# Patient Record
Sex: Male | Born: 1975 | Race: White | Hispanic: Yes | Marital: Married | Smoking: Never smoker
Health system: Southern US, Community
[De-identification: ages and names within clinical notes are randomized; demographics above are authoritative.]

## PROBLEM LIST (undated history)

## (undated) ENCOUNTER — Emergency Department (HOSPITAL_COMMUNITY): Admission: EM | Payer: No Typology Code available for payment source

## (undated) DIAGNOSIS — I10 Essential (primary) hypertension: Secondary | ICD-10-CM

---

## 2006-04-09 ENCOUNTER — Emergency Department (HOSPITAL_COMMUNITY): Admission: EM | Admit: 2006-04-09 | Discharge: 2006-04-09 | Payer: Self-pay | Admitting: Emergency Medicine

## 2009-12-06 ENCOUNTER — Emergency Department (HOSPITAL_COMMUNITY): Admission: EM | Admit: 2009-12-06 | Discharge: 2009-12-06 | Payer: Self-pay | Admitting: Emergency Medicine

## 2011-01-02 LAB — GC/CHLAMYDIA PROBE AMP, GENITAL
Chlamydia, DNA Probe: NEGATIVE
GC Probe Amp, Genital: POSITIVE — AB

## 2015-03-12 ENCOUNTER — Ambulatory Visit (INDEPENDENT_AMBULATORY_CARE_PROVIDER_SITE_OTHER): Payer: 59

## 2015-03-12 ENCOUNTER — Ambulatory Visit (INDEPENDENT_AMBULATORY_CARE_PROVIDER_SITE_OTHER): Payer: 59 | Admitting: Family Medicine

## 2015-03-12 VITALS — BP 124/80 | HR 91 | Temp 98.9°F | Resp 17 | Ht 67.5 in | Wt 180.0 lb

## 2015-03-12 DIAGNOSIS — S62306A Unspecified fracture of fifth metacarpal bone, right hand, initial encounter for closed fracture: Secondary | ICD-10-CM | POA: Diagnosis not present

## 2015-03-12 DIAGNOSIS — T148 Other injury of unspecified body region: Secondary | ICD-10-CM | POA: Diagnosis not present

## 2015-03-12 DIAGNOSIS — M79641 Pain in right hand: Secondary | ICD-10-CM

## 2015-03-12 DIAGNOSIS — M25531 Pain in right wrist: Secondary | ICD-10-CM

## 2015-03-12 DIAGNOSIS — S6991XA Unspecified injury of right wrist, hand and finger(s), initial encounter: Secondary | ICD-10-CM

## 2015-03-12 DIAGNOSIS — T148XXA Other injury of unspecified body region, initial encounter: Secondary | ICD-10-CM

## 2015-03-12 MED ORDER — OXYCODONE-ACETAMINOPHEN 5-325 MG PO TABS: 1.0000 | ORAL_TABLET | Freq: Three times a day (TID) | ORAL | Status: DC | PRN

## 2015-03-12 NOTE — Patient Instructions (Signed)
Fractura de [place], quinto metacarpo (Mining engineerHand Fracture, Fifth Metacarpal) El pequeo metacarpo es el hueso que se encuentra en la base del dedo meique entre el nudillo y la [place]. Una fractura es la ruptura de ese hueso. Una de las fracturas ms comunes en este hueso se denomina "fractura de boxeador". TRATAMIENTO Las fracturas pueden tratarse con los siguientes mtodos:   Reduccin (se colocan nuevamente los huesos en su Environmental consultantlugar), Express Scriptsluego se inmovilizan (sin cortar la piel) para Photographermantener la posicin y se enyesan por aproximadamente seis semanas, o el tiempo que el profesional que lo asiste determine como necesario.  RAFI (reduccin abierta, fijacin interna) el sitio de Printmakerla fractura se abre y las partes del hueso se Industrial/product designercolocan en su lugar con Programmer, applicationsclavos; luego se enyesa durante aproximadamente seis semanas, o el tiempo que el profesional que lo asiste determine como necesario. El profesional que lo Magazine features editorasiste le comentar el tipo de fractura especfica que usted tiene y cual sera el mejor tratamiento para su problema. Si la ciruga fuera el tratamiento de eleccin, la informacin que damos a continuacin es para su uso y para que le deje saber al profesional que lo asiste antes de la Azerbaijanciruga.  INFORME AL PROFESIONAL QUE LO ASISTE SOBRE LOS SIGUIENTES PUNTOS:  Alergias  Medicacin que toma, incluyendo hierbas, gotas oftlmicas, medicamentos de venta libre y cremas  Uso de esteroides (por va oral o cremas)  Problemas anteriores debido a anestsicos o a la novocana  Posibilidad de Psychiatristembarazo, si correspondiera  Antecedentes de haber tenido cogulos sanguneos (tromboflebitis)  Antecedentes de hemorragias o problemas sanguneos  Cirugas previas.  Otros problemas de salud LUEGO DE LA CIRUGA Despus de la ciruga lo llevarn al rea de recuperacin donde un(a) enfermero(a) lo cuidar y Chief Operating Officercontrolar su evolucin. Una vez que despierte, se encuentre estabilizado y pueda ingerir lquidos, excepto que ocurra un  imprevisto, usted podr volver a su casa. Una vez que se encuentre en su hogar, aplique una bolsa de hielo en la zona de la Forganciruga, lo que puede ayudarlo a disminuir las molestias y Restaurant manager, fast fooddetener la hinchazn. INSTRUCCIONES PARA EL CUIDADO DOMICILIARIO  Siga las instrucciones del profesional que lo asiste relacionadas con las actividades, ejercicio, fisioterapia y la conduccin de automviles.  El ejercicio diario es beneficioso para mantener la amplitud de movimientos (movimiento y movilidad) y Primary school teacherla fuerza. Realice los ejercicios tal como se le instruy.  Para disminuir la hinchazn, mantenga elevada la mano lesionada por encima del nivel del corazn, todo el tiempo que le sea posible.  Aplique hielo sobre la lesin durante 15 a 20 minutos por hora mientras se encuentre despierto, durante los 2 primeros Soudersburgdas. Coloque el hielo en una bolsa plstica y ponga una toalla delgada entre la bolsa y Woodstonel yeso.  Mueva los dedos de la mano enyesada varias veces por da.  Si le colocaron un yeso o un molde de Lequirefibra de vidrio:  No trate de rascarse la piel por debajo del molde utilizando objetos filosos o puntiagudos.  Controle todos los Darden Restaurantsdas la piel de alrededor del yeso. Puede colocarse una locin en las zonas rojas o doloridas.  Mantenga el yeso seco. Puede protegerlo durante el bao con una bolsa plstica. No coloque el yeso dentro del agua.  Si le colocaron un entablillado:  selo durante el tiempo que se lo indique su mdico o hasta que concurra a la Mildredconsulta de seguimiento.  No lo moje. Puede protegerlo durante el bao con una bolsa plstica.  Puede aflojar el vendaje elstico que rodea la  tablilla si los dedos se entumecen, siente hormigueo, se enfran o se vuelven de color azul.  No ejerza presin en ninguna parte del yeso o tablilla; podra romperse. Especialmente, no apoye el yeso sobre superficies duras durante las primeras veinticuatro horas despus de la aplicacin.  Tome la medicacin que le  indic el profesional que lo asiste.  Utilice los medicamentos de venta libre o de prescripcin para Chief Technology Officer, Environmental health practitioner o la [place], segn se lo indique el profesional que lo asiste.  Realice el seguimiento segn las instrucciones que le ha dado el profesional que lo asiste. Esto es muy importante para evitar una lesin o discapacidad permanente y Chief Technology Officer crnico.  Siga las instrucciones relativas a la derivacin a otros mdicos, a la fisioterapia, y a Product/process development scientist. Cualquier demora en obtener la atencin necesaria puede resultar en una lesin [place], discapacidad y dolor crnico. SOLICITE ATENCIN MDICA SI:  Si aumenta el sangrado (ms de una pequea mancha) en la zona de la herida o debajo del yeso o entablillado si tiene una herida debajo del yeso, consecuencia de la [place].  Presenta enrojecimiento, hinchazn o aumento del dolor en la herida o por debajo del yeso o la tablilla.  Observa pus que proviene de la herida o debajo del yeso o tablilla.  La temperatura oral se eleva sin motivo por encima de 38,9 C (102 F).  Percibe un olor ftido que proviene de la herida, del vendaje o de debajo del yeso o tablilla.  Usted no puede mover el dedo meique. SOLICITE ATENCIN MDICA DE INMEDIATO SI: Presenta una erupcin cutnea, siente dificultad para respirar o tiene algn otro problema de alergia. Si no tiene Personal assistant yeso para observar la lesin, podr Insurance risk surveyor prdida pequea de sangre como una mancha en el exterior del yeso. Informe todos estos hechos al profesional que lo asiste. EST SEGURO QUE:   Comprende las instrucciones para el alta mdica.  Controlar su enfermedad.  Solicitar atencin mdica de inmediato segn las indicaciones. Document Released: 09/28/2005 Document Revised: 12/21/2011 [facility] Patient Information 2015 [place], [place]. This information is not intended to replace advice given to you by your health care provider. Make sure you  discuss any questions you have with your health care provider.

## 2015-03-12 NOTE — Progress Notes (Signed)
MRN: [id number] DOB: 1975/12/06  Subjective:   [name] [name] is a 39 y.o. male presenting for chief complaint of Hand Injury  Reports one-day history of right hand injury. Patient states that he was moving furniture, furniture slipped and fell over his right hand, patient states that he was able to lift the furniture up with his other hand to free his right hand. Patient felt immediate pain with swelling, has difficulty using his right hand with gripping and moving his fingers due to his pain. Also reports right wrist pain. 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 Denies decreased sensation, erythema, bruising, bony deformity. Has not tried any medications for relief. Denies any other aggravating or relieving factors, no other questions or concerns.  [name] has a current medication list which includes the following prescription(s): oxycodone-acetaminophen. He has No Known Allergies.  [name]  has no past medical history on file. Also  has no past surgical history on file.  ROS As in subjective.  Objective:   Vitals: BP 124/80 mmHg  Pulse 91  Temp(Src) 98.9 F (37.2 C) (Oral)  Resp 17  Ht 5' 7.5" (1.715 m)  Wt 180 lb (81.647 kg)  BMI 27.76 kg/m2  SpO2 98%  Physical Exam  Constitutional: He is oriented to person, place, and time. He appears well-developed and well-nourished.  Cardiovascular: Normal rate.   Pulmonary/Chest: Effort normal.  Musculoskeletal:       Right hand: He exhibits tenderness, bony tenderness and swelling. He exhibits normal range of motion, normal capillary refill, no deformity and no laceration. Normal sensation noted. Decreased strength (secondary to pain) noted.       Hands: Neurological: He is alert and oriented to person, place, and time.  Skin: Skin is warm and dry. No rash noted. No pallor.   [facility] reading (PRIMARY) by  Dr. Alwyn RenHopper and PA-Shamon Cothran. Right hand and right wrist:  comminuted fracture of base of 5th metacarpal involving joint space. No other acute findings.  Dg Wrist Complete Right  03/12/2015   CLINICAL DATA:  Recent injury with right hand pain, initial encounter  EXAM: RIGHT WRIST - COMPLETE 3+ VIEW  COMPARISON:  None.  FINDINGS: There is a fracture through the base of the fifth metacarpal identified with mild lateral displacement of the more distal fracture fragments. Soft tissue swelling is noted. No other fractures are seen.  IMPRESSION: Fracture through the base of the fifth metacarpal.   Electronically Signed   By: Alcide CleverMark  Lukens M.D.   On: 03/12/2015 20:46   Dg Hand Complete Right  03/12/2015   CLINICAL DATA:  Right hand injury with pain, initial encounter  EXAM: RIGHT HAND - COMPLETE 3+ VIEW  COMPARISON:  None.  FINDINGS: There is a comminuted fracture through the base of the fifth metacarpal with some angulation at the fracture site. No other fractures are seen. Generalized soft tissue swelling is noted about the wrist.  IMPRESSION: Fifth metacarpal fracture.   Electronically Signed   By: Alcide CleverMark  Lukens M.D.   On: 03/12/2015 20:47   PROCEDURE NOTE: Ulnar gutter splint Applied a 10"x5" ulnar gutter splint to right hand up to DIP of 5th finger, hand was placed in slight extension.  Assessment and Plan :   1. Hand injury, right, initial encounter 2. Right hand pain 3. Right wrist pain 4. Comminuted fracture of bone 5. Fracture of fifth metacarpal bone of right hand,  closed, initial encounter - Stabilized with ulnar gutter splint as above, refer to hand specialist [place], provided with prescription for Percocet for right hand pain  Wallis Bamberg, PA-C [facility] [phone] 03/12/2015 8:27 PM

## 2015-03-13 ENCOUNTER — Telehealth: Payer: Self-pay

## 2015-03-13 NOTE — Telephone Encounter (Signed)
[facility] 03/19/15 at 11:45 am. 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 He is requesting a work note to write him out until his appointment [facility]. Please fax to [phone] attn Domenic MorasGlenn Blackburn. If any questions please call patients nephew at [phone]

## 2015-03-14 NOTE — Progress Notes (Signed)
  Subjective:  Patient ID: [name] [name], male    DOB: Jul 16, 1976  Age: 39 y.o. MRN: [id number]  Discussed with Gurney MaxinMike Mani, PA. Xray examined and proximal 5th metacarpal comminuted fracture with joint involvement noted.     Objective:   Did not examine patient2  Assessment & Plan:   Assessment: Fracture 5th metacarpal right hand  Plan: Gutter splint, refer to ortho hand this week. Kristofer Schaffert, MD 03/14/2015

## 2015-03-14 NOTE — Telephone Encounter (Signed)
Can note be written? Please advise.

## 2015-03-15 NOTE — Telephone Encounter (Signed)
Note done and faxed.

## 2015-03-15 NOTE — Telephone Encounter (Signed)
Fine to write note

## 2015-09-21 ENCOUNTER — Encounter (HOSPITAL_COMMUNITY): Payer: Self-pay

## 2015-09-21 ENCOUNTER — Emergency Department (HOSPITAL_COMMUNITY)
Admission: EM | Admit: 2015-09-21 | Discharge: 2015-09-21 | Disposition: A | Discharge status: Home / Self Care | Payer: 59 | Attending: Emergency Medicine | Admitting: Emergency Medicine

## 2015-09-21 DIAGNOSIS — N390 Urinary tract infection, site not specified: Secondary | ICD-10-CM | POA: Insufficient documentation

## 2015-09-21 DIAGNOSIS — R319 Hematuria, unspecified: Secondary | ICD-10-CM

## 2015-09-21 DIAGNOSIS — R369 Urethral discharge, unspecified: Secondary | ICD-10-CM | POA: Diagnosis present

## 2015-09-21 LAB — URINE MICROSCOPIC-ADD ON

## 2015-09-21 LAB — URINALYSIS, ROUTINE W REFLEX MICROSCOPIC
BILIRUBIN URINE: NEGATIVE
GLUCOSE, UA: NEGATIVE mg/dL
Ketones, ur: NEGATIVE mg/dL
Nitrite: NEGATIVE
PROTEIN: NEGATIVE mg/dL
Specific Gravity, Urine: 1.023 (ref 1.005–1.030)
pH: 6 (ref 5.0–8.0)

## 2015-09-21 LAB — RPR: RPR: NONREACTIVE

## 2015-09-21 LAB — HIV ANTIBODY (ROUTINE TESTING W REFLEX): HIV Screen 4th Generation wRfx: NONREACTIVE

## 2015-09-21 MED ORDER — AZITHROMYCIN 250 MG PO TABS
1000.0000 mg | ORAL_TABLET | Freq: Once | ORAL | Status: AC
  Administered 2015-09-21: 1000 mg via ORAL
  Filled 2015-09-21: qty 4

## 2015-09-21 MED ORDER — CEFTRIAXONE SODIUM 250 MG IJ SOLR
250.0000 mg | Freq: Once | INTRAMUSCULAR | Status: AC
  Administered 2015-09-21: 250 mg via INTRAMUSCULAR
  Filled 2015-09-21: qty 250

## 2015-09-21 MED ORDER — LIDOCAINE HCL (PF) 1 % IJ SOLN
1.0000 mL | Freq: Once | INTRAMUSCULAR | Status: AC
  Administered 2015-09-21: 1 mL
  Filled 2015-09-21: qty 5

## 2015-09-21 MED ORDER — CEPHALEXIN 250 MG PO CAPS
500.0000 mg | ORAL_CAPSULE | Freq: Once | ORAL | Status: AC
  Administered 2015-09-21: 500 mg via ORAL
  Filled 2015-09-21: qty 2

## 2015-09-21 MED ORDER — CEPHALEXIN 500 MG PO CAPS: 500.0000 mg | ORAL_CAPSULE | Freq: Four times a day (QID) | ORAL | Status: DC

## 2015-09-21 MED ORDER — PHENAZOPYRIDINE HCL 200 MG PO TABS: 200.0000 mg | ORAL_TABLET | Freq: Three times a day (TID) | ORAL | Status: DC | PRN

## 2015-09-21 NOTE — ED Notes (Signed)
Pt states that he has puss draining from his penis that started yesterday. Pt complains of difficulty urinating and pain while urinating.

## 2015-09-21 NOTE — Discharge Instructions (Signed)
You have been treated for gonorrhea and chlamydia. We have tested you for gonorrhea, chlamydia, HIV and syphilis. If any of these tests are positive, you will be contacted.   Infeccin urinaria  (Urinary Tract Infection)  La infeccin urinaria puede ocurrir en Corporate treasurercualquier lugar del tracto urinario. El tracto urinario es un sistema de drenaje del cuerpo por el que se eliminan los desechos y el exceso de [place]. El tracto urinario est formado por dos riones, dos urteres, la vejiga y Engineer, miningla uretra. Los riones son rganos que tienen forma de frijol. Cada rin tiene aproximadamente el tamao del puo. Estn situados debajo de las Marquettecostillas, uno a cada lado de la columna vertebral CAUSAS  La causa de la infeccin son los microbios, que son organismos microscpicos, que incluyen hongos, virus, y bacterias. Estos organismos son tan pequeos que slo pueden verse a travs del microscopio. Las bacterias son los microorganismos que ms comnmente causan infecciones urinarias.  SNTOMAS  Los sntomas pueden variar segn la edad y el sexo del paciente y por la ubicacin de la infeccin. Los sntomas en las mujeres jvenes incluyen la necesidad frecuente e intensa de orinar y una sensacin dolorosa de ardor en la vejiga o en la uretra durante la miccin. Las mujeres y los hombres mayores podrn sentir cansancio, temblores y debilidad y Futures tradersentir dolores musculares y Engineer, miningdolor abdominal. Si tiene Sterling Ranchfiebre, puede significar que la infeccin est en los riones. Otros sntomas son dolor en la espalda o en los lados debajo de las [place]costillas, nuseas y vmitos.  DIAGNSTICO  Para diagnosticar una infeccin urinaria, el mdico le preguntar acerca de sus sntomas. Genuine Partsambin le solicitar una Pocassetmuestra de Comorosorina. La muestra de orina se analiza para Engineer, manufacturingdetectar bacterias y glbulos blancos de Risk managerla sangre. Los glbulos blancos se forman en el organismo para ayudar a Artistcombatir las infecciones.  TRATAMIENTO  Por lo general, las infecciones urinarias  pueden tratarse con medicamentos. Debido a que la Harley-Davidsonmayora de las infecciones son causadas por bacterias, por lo general pueden tratarse con antibiticos. La eleccin del antibitico y la duracin del tratamiento depender de sus sntomas y el tipo de bacteria causante de la infeccin.  INSTRUCCIONES PARA EL CUIDADO EN EL HOGAR   Si le recetaron antibiticos, tmelos exactamente como su mdico le indique. Termine el medicamento aunque se sienta mejor despus de haber tomado slo algunos.  Beba gran cantidad de lquido para mantener la orina de tono claro o color amarillo plido.  Evite la cafena, el t y las [address] Placebebidas gaseosas. Estas sustancias irritan la vejiga.  Vaciar la vejiga con frecuencia. Evite retener la orina durante largos perodos.  Vace la vejiga antes y despus de Management consultanttener relaciones sexuales.  Despus de mover el intestino, las mujeres deben higienizarse la regin perineal desde adelante hacia atrs. Use slo un papel tissue por vez. SOLICITE ATENCIN MDICA SI:   Siente dolor en la espalda.  Le sube la fiebre.  Los sntomas no mejoran luego de [address] das. SOLICITE ATENCIN MDICA DE INMEDIATO SI:   Siente dolor intenso en la espalda o en la zona inferior del abdomen.  Comienza a sentir escalofros.  Tiene nuseas o vmitos.  Tiene una sensacin continua de quemazn o molestias al ConocoPhillipsorinar. ASEGRESE DE QUE:   Comprende estas instrucciones.  Controlar su enfermedad.  Solicitar ayuda de inmediato si no mejora o empeora.   Esta informacin no tiene Theme park managercomo fin reemplazar el consejo del mdico. Asegrese de hacerle al mdico cualquier pregunta que tenga.   Document Released: 07/08/2005 Document  Revised: 06/22/2012 Elsevier Interactive Patient Education [employer]

## 2015-09-21 NOTE — ED Provider Notes (Signed)
TIME SEEN: 5:10 AM  CHIEF COMPLAINT: "I have pus from my penis"  HPI: Patient is a 39 y.o. male who is sexually active with his wife and has no medical history who presents to the emergency department with purulent drainage from his penis that he reports her yesterday. He is having dysuria. No hematuria, urgency or frequency. He is not a diabetic. Denies prior history of STDs. Denies any other sexual partners. Denies fever, nausea, vomiting or diarrhea. No testicular pain or swelling.   Patient speaks Spanish. Phone interpreter used.  ROS: See HPI Constitutional: no fever  Eyes: no drainage  ENT: no runny nose   Cardiovascular:  no chest pain  Resp: no SOB  GI: no vomiting GU:  dysuria Integumentary: no rash  Allergy: no hives  Musculoskeletal: no leg swelling  Neurological: no slurred speech ROS otherwise negative  PAST MEDICAL HISTORY/PAST SURGICAL HISTORY:  History reviewed. No pertinent past medical history.  MEDICATIONS:  Prior to Admission medications   Medication Sig Start Date End Date Taking? Authorizing Provider  oxyCODONE-acetaminophen (ROXICET) 5-325 MG per tablet Take 1 tablet by mouth every 8 (eight) hours as needed for severe pain. 03/12/15   Wallis BambergMario Mani, PA-C    ALLERGIES:  No Known Allergies  SOCIAL HISTORY:  Social History  Substance Use Topics  . Smoking status: Never Smoker   . Smokeless tobacco: Not on file  . Alcohol Use: 0.0 oz/week    0 Standard drinks or equivalent per week    FAMILY HISTORY: History reviewed. No pertinent family history.  EXAM: BP 136/87 mmHg  Pulse 72  Temp(Src) 98.6 F (37 C) (Oral)  Resp 14  SpO2 99% CONSTITUTIONAL: Alert and oriented and responds appropriately to questions. Well-appearing; well-nourished HEAD: Normocephalic EYES: Conjunctivae clear, PERRL ENT: normal nose; no rhinorrhea; moist mucous membranes; pharynx without lesions noted NECK: Supple, no meningismus, no LAD  CARD: RRR; S1 and S2 appreciated; no  murmurs, no clicks, no rubs, no gallops RESP: Normal chest excursion without splinting or tachypnea; breath sounds clear and equal bilaterally; no wheezes, no rhonchi, no rales, no hypoxia or respiratory distress, speaking full sentences ABD/GI: Normal bowel sounds; non-distended; soft, non-tender, no rebound, no guarding, no peritoneal signs GU:  GU:  Normal external genitalia, uncircumcised male, normal penile shaft, no blood or discharge at the urethral meatus but there is yellow appearing discharge around the glans, no swelling of the glans or shaft, no testicular masses or tenderness on exam, no scrotal masses or swelling, no hernias appreciated, 2+ femoral pulses bilaterally; no perineal erythema, warmth, subcutaneous air or crepitus; no high riding testicle, normal bilateral cremasteric reflex BACK:  The back appears normal and is non-tender to palpation, there is no CVA tenderness EXT: Normal ROM in all joints; non-tender to palpation; no edema; normal capillary refill; no cyanosis, no calf tenderness or swelling    SKIN: Normal color for age and race; warm NEURO: Moves all extremities equally, sensation to light touch intact diffusely, cranial nerves II through XII intact PSYCH: The patient's mood and manner are appropriate. Grooming and personal hygiene are appropriate.  MEDICAL DECISION MAKING: Patient here with possible STD, UTI. No signs of balanitis, phimosis. Abdominal exam benign. Hemodynamically stable. No testicular tenderness or masses. Urine shows hemoglobin, leukocytes and rare bacteria. Culture pending. Will treat UTI with Keflex. He is also been given ceftriaxone 250 mg IM and azithromycin 1 g by mouth for possible STD. Gonorrhea, chlamydia, HIV and syphilis tests pending. Have provided him outpatient resources. Discussed return  precautions. Have advised him to avoid sexual activity until he is aware of his STD results and if they're positive and his wife will need to be treated as  well. He verbalized understanding and is comfortable with this plan.       Layla Maw Shaquella Stamant, DO 09/21/15 [phone]

## 2015-09-21 NOTE — ED Notes (Signed)
Interpreter [name] HoardLouis [phone]

## 2015-09-22 LAB — URINE CULTURE: Culture: NO GROWTH

## 2015-09-23 LAB — GC/CHLAMYDIA PROBE AMP (~~LOC~~) NOT AT ARMC
CHLAMYDIA, DNA PROBE: NEGATIVE
Neisseria Gonorrhea: POSITIVE — AB

## 2015-09-24 ENCOUNTER — Telehealth (HOSPITAL_COMMUNITY): Payer: Self-pay

## 2015-09-24 NOTE — Telephone Encounter (Signed)
Positive for gonorrhea. Treated per protocol. DHHS form faxed. Attempting to contact.  

## 2015-09-26 ENCOUNTER — Telehealth (HOSPITAL_BASED_OUTPATIENT_CLINIC_OR_DEPARTMENT_OTHER): Payer: Self-pay | Admitting: Emergency Medicine

## 2015-09-28 ENCOUNTER — Telehealth (HOSPITAL_COMMUNITY): Payer: Self-pay

## 2015-09-28 NOTE — Telephone Encounter (Signed)
Unable to reach by telephone. Letter sent to address on record.  

## 2015-10-15 ENCOUNTER — Telehealth (HOSPITAL_COMMUNITY): Payer: Self-pay

## 2015-10-15 NOTE — Telephone Encounter (Signed)
Unable to reach by phone or mail.  Chart closed.   

## 2016-08-04 ENCOUNTER — Ambulatory Visit (INDEPENDENT_AMBULATORY_CARE_PROVIDER_SITE_OTHER): Payer: 59 | Admitting: Physician Assistant

## 2016-08-04 VITALS — BP 124/82 | HR 84 | Temp 98.9°F | Resp 17 | Ht 68.5 in | Wt 188.0 lb

## 2016-08-04 DIAGNOSIS — G44209 Tension-type headache, unspecified, not intractable: Secondary | ICD-10-CM | POA: Diagnosis not present

## 2016-08-04 DIAGNOSIS — D696 Thrombocytopenia, unspecified: Secondary | ICD-10-CM | POA: Diagnosis not present

## 2016-08-04 DIAGNOSIS — R809 Proteinuria, unspecified: Secondary | ICD-10-CM | POA: Diagnosis not present

## 2016-08-04 DIAGNOSIS — Z23 Encounter for immunization: Secondary | ICD-10-CM

## 2016-08-04 LAB — POCT URINALYSIS DIP (MANUAL ENTRY)
BILIRUBIN UA: NEGATIVE
BILIRUBIN UA: NEGATIVE
GLUCOSE UA: NEGATIVE
Leukocytes, UA: NEGATIVE
NITRITE UA: NEGATIVE
PH UA: 6.5
Protein Ur, POC: 30 — AB
RBC UA: NEGATIVE
SPEC GRAV UA: 1.02
Urobilinogen, UA: 1

## 2016-08-04 LAB — COMPLETE METABOLIC PANEL WITH GFR
ALBUMIN: 4.5 g/dL (ref 3.6–5.1)
ALK PHOS: 84 U/L (ref 40–115)
ALT: 87 U/L — ABNORMAL HIGH (ref 9–46)
AST: 81 U/L — ABNORMAL HIGH (ref 10–40)
BILIRUBIN TOTAL: 1 mg/dL (ref 0.2–1.2)
BUN: 16 mg/dL (ref 7–25)
CALCIUM: 9.2 mg/dL (ref 8.6–10.3)
CO2: 20 mmol/L (ref 20–31)
Chloride: 102 mmol/L (ref 98–110)
Creat: 0.85 mg/dL (ref 0.60–1.35)
Glucose, Bld: 107 mg/dL — ABNORMAL HIGH (ref 65–99)
POTASSIUM: 4 mmol/L (ref 3.5–5.3)
SODIUM: 137 mmol/L (ref 135–146)
TOTAL PROTEIN: 7.9 g/dL (ref 6.1–8.1)

## 2016-08-04 LAB — POCT CBC
Granulocyte percent: 62.6 %G (ref 37–80)
HEMATOCRIT: 44.1 % (ref 43.5–53.7)
HEMOGLOBIN: 15.8 g/dL (ref 14.1–18.1)
LYMPH, POC: 1.8 (ref 0.6–3.4)
MCH: 32.9 pg — AB (ref 27–31.2)
MCHC: 36 g/dL — AB (ref 31.8–35.4)
MCV: 91.4 fL (ref 80–97)
MID (cbc): 0.5 (ref 0–0.9)
MPV: 10.3 fL (ref 0–99.8)
POC GRANULOCYTE: 3.9 (ref 2–6.9)
POC LYMPH PERCENT: 29.2 %L (ref 10–50)
POC MID %: 8.2 %M (ref 0–12)
Platelet Count, POC: 116 10*3/uL — AB (ref 142–424)
RBC: 4.82 M/uL (ref 4.69–6.13)
RDW, POC: 12.7 %
WBC: 6.3 10*3/uL (ref 4.6–10.2)

## 2016-08-04 LAB — GLUCOSE, POCT (MANUAL RESULT ENTRY): POC GLUCOSE: 95 mg/dL (ref 70–99)

## 2016-08-04 LAB — POCT SEDIMENTATION RATE: POCT SED RATE: 15 mm/hr (ref 0–22)

## 2016-08-04 MED ORDER — NAPROXEN 500 MG PO TABS: 500.0000 mg | ORAL_TABLET | Freq: Two times a day (BID) | ORAL | 0 refills | Status: DC

## 2016-08-04 NOTE — Progress Notes (Addendum)
By signing my name below, I, Stann Ore, attest that this documentation has been prepared under the direction and in the presence of Deliah Boston, PA-C. Electronically Signed: Stann Ore, Scribe. 08/04/2016 , 2:58 PM .  Patient was seen in Room 4 .  08/04/2016 5:07 PM   DOB: January 20, 1976 / MRN: [id number]  SUBJECTIVE:  [name] [name] is a 40 y.o. male presenting for headache that started 2 days ago. Patient states having headache from the front to the back of his head bilaterally. He's been feeling nauseous and wanting to vomit all day long.  He also mentions throat feeling sore. He hasn't been able to sleep well since the headache started, as well as body aches He also mentions having some blurry vision in both eyes. He denies photosensitivity. He denies any medication changes recently. He's taken tylenol for this headache. He informs his brother has diabetes, and had similar symptoms prior to being diagnosed. He has occasional alcohol use, about 1 drink a day, "it's not much".   He requests flu shot today.   His primary language is [place]. [employer] video interpreter was used: Doreene Burke [phone]  He has No Known Allergies.   He  has no past medical history on file.    He  reports that he has never smoked. He does not have any smokeless tobacco history on file. He reports that he drinks alcohol. He reports that he does not use drugs. He  has no sexual activity history on file. The patient  has no past surgical history on file.  His family history is not on file.  Review of Systems  Constitutional: Negative for fever.  HENT: Positive for sore throat.   Eyes: Negative for photophobia and pain.  Gastrointestinal: Positive for nausea and vomiting.  Neurological: Positive for headaches. Negative for dizziness.  Psychiatric/Behavioral: Negative for substance abuse. The patient has insomnia.     The problem list and medications were reviewed and updated by myself where necessary and  exist elsewhere in the encounter.   OBJECTIVE:  BP 124/82 (BP Location: Right Arm, Patient Position: Sitting, Cuff Size: Normal)    Pulse 84    Temp 98.9 F (37.2 C) (Oral)    Resp 17    Ht 5' 8.5" (1.74 m)    Wt 188 lb (85.3 kg)    SpO2 98%    BMI 28.17 kg/m   Physical Exam  Constitutional: He is oriented to person, place, and time. Vital signs are normal. He appears well-developed.  HENT:  Right Ear: Tympanic membrane normal.  Left Ear: Tympanic membrane normal.  Nose: Nose normal.  Mouth/Throat: Oropharynx is clear and moist.  TM pearly gray bilaterally  Cardiovascular: Normal rate.   Pulmonary/Chest: Effort normal. No respiratory distress.  Musculoskeletal: Normal range of motion.  Neurological: He is alert and oriented to person, place, and time. He has normal strength. He is not disoriented. No cranial nerve deficit or sensory deficit. He displays a negative Romberg sign. Coordination and gait normal. GCS eye subscore is 4. GCS verbal subscore is 5. GCS motor subscore is 6.  Reflex Scores:      Tricep reflexes are 2+ on the right side and 2+ on the left side.      Bicep reflexes are 2+ on the right side and 2+ on the left side.      Brachioradialis reflexes are 2+ on the right side and 2+ on the left side.      Patellar reflexes are 2+ on  the right side and 2+ on the left side.      Achilles reflexes are 2+ on the right side and 2+ on the left side. Negative cerebellar testing  Skin: Skin is warm and dry.  Psychiatric: He has a normal mood and affect. His behavior is normal. Judgment and thought content normal.    Results for orders placed or performed in visit on 08/04/16 (from the past 72 hour(s))  POCT glucose (manual entry)     Status: None   Collection Time: 08/04/16  3:12 PM  Result Value Ref Range   POC Glucose 95 70 - 99 mg/dl  POCT urinalysis dipstick     Status: Abnormal   Collection Time: 08/04/16  3:12 PM  Result Value Ref Range   Color, UA yellow yellow    Clarity, UA clear clear   Glucose, UA negative negative   Bilirubin, UA negative negative   Ketones, POC UA negative negative   Spec Grav, UA 1.020    Blood, UA negative negative   pH, UA 6.5    Protein Ur, POC =30 (A) negative   Urobilinogen, UA 1.0    Nitrite, UA Negative Negative   Leukocytes, UA Negative Negative  POCT CBC     Status: Abnormal   Collection Time: 08/04/16  3:14 PM  Result Value Ref Range   WBC 6.3 4.6 - 10.2 K/uL   Lymph, poc 1.8 0.6 - 3.4   POC LYMPH PERCENT 29.2 10 - 50 %L   MID (cbc) 0.5 0 - 0.9   POC MID % 8.2 0 - 12 %M   POC Granulocyte 3.9 2 - 6.9   Granulocyte percent 62.6 37 - 80 %G   RBC 4.82 4.69 - 6.13 M/uL   Hemoglobin 15.8 14.1 - 18.1 g/dL   HCT, POC 08.644.1 57.843.5 - 53.7 %   MCV 91.4 80 - 97 fL   MCH, POC 32.9 (A) 27 - 31.2 pg   MCHC 36.0 (A) 31.8 - 35.4 g/dL   RDW, POC 46.912.7 %   Platelet Count, POC 116 (A) 142 - 424 K/uL   MPV 10.3 0 - 99.8 fL  POCT SEDIMENTATION RATE     Status: None   Collection Time: 08/04/16  4:55 PM  Result Value Ref Range   POCT SED RATE 15 0 - 22 mm/hr    No results found.  ASSESSMENT AND PLAN  [name] was seen today for headache and immunizations.  Diagnoses and all orders for this visit:  Tension-type headache, not intractable, unspecified chronicity pattern: His symptoms are non specific.  His neurological exam is negative.  Will further investigate problems 2 and 3.   -     POCT CBC -     POCT glucose (manual entry) -     POCT urinalysis dipstick  Proteinuria, unspecified type -     POCT urinalysis dipstick; Future  Thrombocytopenia (HCC) -     POCT SEDIMENTATION RATE -     COMPLETE METABOLIC PANEL WITH GFR -     HIV antibody -     Hepatitis panel, acute -     naproxen (NAPROSYN) 500 MG tablet; Take 1 tablet (500 mg total) by mouth 2 (two) times daily with a meal.    The patient is advised to call or return to clinic if he does not see an improvement in symptoms, or to seek the care of the closest  emergency department if he worsens with the above plan.   This note was scribed  in my presence and I performed the services described in the this documentation.   Deliah Boston, MHS, PA-C [facility] 08/04/2016 5:07 PM

## 2016-08-04 NOTE — Patient Instructions (Signed)
     IF you received an x-ray today, you will receive an invoice from [facility]. Please contact [facility] at [phone] with questions or concerns regarding your invoice.   IF you received labwork today, you will receive an invoice from [facility]. Please contact [facility] at [phone] with questions or concerns regarding your invoice.   Our billing staff will not be able to assist you with questions regarding bills from these companies.  You will be contacted with the lab results as soon as they are available. The fastest way to get your results is to activate your My Chart account. Instructions are located on the last page of this paperwork. If you have not heard from us regarding the results in 2 weeks, please contact this office.      

## 2016-08-05 LAB — HEPATITIS PANEL, ACUTE
HCV AB: NEGATIVE
HEP B C IGM: NONREACTIVE
HEP B S AG: NEGATIVE
Hep A IgM: NONREACTIVE

## 2016-08-05 LAB — HIV ANTIBODY (ROUTINE TESTING W REFLEX): HIV: NONREACTIVE

## 2016-08-11 ENCOUNTER — Telehealth: Payer: Self-pay

## 2016-08-11 ENCOUNTER — Ambulatory Visit (INDEPENDENT_AMBULATORY_CARE_PROVIDER_SITE_OTHER): Payer: 59 | Admitting: Physician Assistant

## 2016-08-11 VITALS — BP 102/62 | HR 82 | Temp 98.4°F | Resp 16 | Ht 68.5 in | Wt 186.4 lb

## 2016-08-11 DIAGNOSIS — R3121 Asymptomatic microscopic hematuria: Secondary | ICD-10-CM | POA: Diagnosis not present

## 2016-08-11 LAB — POCT URINALYSIS DIP (MANUAL ENTRY)
BILIRUBIN UA: NEGATIVE
Glucose, UA: NEGATIVE
Ketones, POC UA: NEGATIVE
LEUKOCYTES UA: NEGATIVE
NITRITE UA: NEGATIVE
PH UA: 6
RBC UA: NEGATIVE
Spec Grav, UA: >=1.03
UROBILINOGEN UA: 0.2

## 2016-08-11 NOTE — Progress Notes (Signed)
Lab only encounter. His proteinuria has persisted. I am referring him to urology for further work up.   Deliah BostonMichael Clark, MS, PA-C 3:55 PM, 08/11/2016   Results for orders placed or performed in visit on 08/11/16  POCT urinalysis dipstick  Result Value Ref Range   Color, UA yellow yellow   Clarity, UA clear clear   Glucose, UA negative negative   Bilirubin, UA negative negative   Ketones, POC UA negative negative   Spec Grav, UA >=1.030    Blood, UA negative negative   pH, UA 6.0    Protein Ur, POC trace (A) negative   Urobilinogen, UA 0.2    Nitrite, UA Negative Negative   Leukocytes, UA Negative Negative

## 2016-08-11 NOTE — Patient Instructions (Signed)
     IF you received an x-ray today, you will receive an invoice from [facility]. Please contact [facility] at [phone] with questions or concerns regarding your invoice.   IF you received labwork today, you will receive an invoice from [facility]. Please contact [facility] at [phone] with questions or concerns regarding your invoice.   Our billing staff will not be able to assist you with questions regarding bills from these companies.  You will be contacted with the lab results as soon as they are available. The fastest way to get your results is to activate your My Chart account. Instructions are located on the last page of this paperwork. If you have not heard from us regarding the results in 2 weeks, please contact this office.      

## 2016-08-11 NOTE — Telephone Encounter (Signed)
Per Deliah BostonMichael Clark PA-C LMVM advising patient he was going to referred to a urologist for proteinuria.  They will be calling him with the appointment time and date.  If there are any further questions, please call back.

## 2016-08-12 NOTE — Progress Notes (Signed)
Phone rings then hangs up.

## 2018-03-15 ENCOUNTER — Ambulatory Visit: Payer: 59 | Admitting: Urgent Care

## 2018-03-15 ENCOUNTER — Encounter: Payer: Self-pay | Admitting: Urgent Care

## 2018-03-15 VITALS — BP 142/84 | HR 90 | Temp 98.7°F | Resp 16 | Ht 68.5 in | Wt 194.0 lb

## 2018-03-15 DIAGNOSIS — R945 Abnormal results of liver function studies: Secondary | ICD-10-CM | POA: Diagnosis not present

## 2018-03-15 DIAGNOSIS — R7989 Other specified abnormal findings of blood chemistry: Secondary | ICD-10-CM

## 2018-03-15 DIAGNOSIS — R51 Headache: Secondary | ICD-10-CM

## 2018-03-15 DIAGNOSIS — R42 Dizziness and giddiness: Secondary | ICD-10-CM | POA: Diagnosis not present

## 2018-03-15 DIAGNOSIS — Z833 Family history of diabetes mellitus: Secondary | ICD-10-CM | POA: Diagnosis not present

## 2018-03-15 DIAGNOSIS — R519 Headache, unspecified: Secondary | ICD-10-CM

## 2018-03-15 DIAGNOSIS — R030 Elevated blood-pressure reading, without diagnosis of hypertension: Secondary | ICD-10-CM | POA: Diagnosis not present

## 2018-03-15 MED ORDER — LISINOPRIL 10 MG PO TABS: 10.0000 mg | ORAL_TABLET | Freq: Every day | ORAL | 3 refills | Status: DC

## 2018-03-15 NOTE — Progress Notes (Signed)
  MRN: [id number] DOB: 05-29-1976  Subjective:   [name] PingJose A [name] is a 42 y.o. male presenting for 3 day history of constant, frontal headache. Has had associated dizziness. Has tried APAP with some relief. Denies fever, chest pain, shob, heart racing, palpitations, n/v, abdominal pain, hematuria. Denies smoking cigarettes. Has ~2 drinks of alcohol per week. [name] [name] is not currently taking any medications. Has No Known Allergies. Denies past medical and surgical history. Drinks ~2 bottles of water. Has sodas daily as well. Works in [employer] with [employer], can have a strenuous work load. Does not exercise. Of note, patient reports that he went to a different office a few weeks ago for an infection of his hand, was advised to have a recheck for high blood pressure. Has 2 brother that died from diabetes.   Objective:   Vitals: BP (!) 142/84   Pulse 90   Temp 98.7 F (37.1 C) (Oral)   Resp 16   Ht 5' 8.5" (1.74 m)   Wt 194 lb (88 kg)   SpO2 99%   BMI 29.07 kg/m   BP Readings from Last 3 Encounters:  03/15/18 (!) 142/84  08/11/16 102/62  08/04/16 124/82    Physical Exam  Constitutional: He is oriented to person, place, and time. He appears well-developed and well-nourished.  HENT:  Mouth/Throat: Oropharynx is clear and moist.  Eyes: No scleral icterus.  Neck: Normal range of motion. Neck supple. No thyromegaly present.  Cardiovascular: Normal rate, regular rhythm and intact distal pulses. Exam reveals no gallop and no friction rub.  No murmur heard. Pulmonary/Chest: No respiratory distress. He has no wheezes. He has no rales.  Abdominal: Soft. Bowel sounds are normal. He exhibits no distension and no mass. There is no tenderness. There is no rebound and no guarding.  Neurological: He is alert and oriented to person, place, and time.  Skin: Skin is warm and dry.  Psychiatric: He has a normal mood and affect.   Assessment and Plan :   Elevated blood pressure reading  Dizziness - Plan:  Hemoglobin A1c  Generalized headache  Family history of diabetes mellitus - Plan: Hemoglobin A1c  Elevated LFTs - Plan: Comprehensive metabolic panel  We will start patient on lisinopril.  Counseled patient on healthier lifestyle including adequate hydration and healthy diet.  Labs pending.  Will recheck in 4 weeks or sooner as discussed during his office visit.

## 2018-03-15 NOTE — Patient Instructions (Addendum)
Prevencin de la diabetes mellitus tipo 2 Preventing Type 2 Diabetes Mellitus La diabetes tipo 2 (diabetes mellitus tipo 2) es una enfermedad a largo plazo (crnica) que afecta los niveles de azcar en la sangre (glucosa). Normalmente, una hormona denominada insulina permite el ingreso de la glucosa en las clulas del cuerpo. Las clulas usan la glucosa para obtener energa. En la diabetes tipo 2, puede presentarse uno de los siguientes problemas, o ambos:  El cuerpo no produce la cantidad suficiente de insulina.  El cuerpo no responde de manera adecuada a la insulina que produce (resistencia a la insulina).  La resistencia a la insulina o la falta de insulina hacen que el exceso de glucosa se acumule en la sangre, en lugar de ir a las clulas. Como resultado, se produce un nivel alto de glucemia (hiperglucemia), lo cual puede causar muchas complicaciones. Al tener sobreseo o ser obeso y tener un estilo de vida inactivo (sedentario) puede aumentar su riesgo de padecer diabetes. La diabetes tipo 2 se puede retrasar o prevenir al realizar determinados cambios en la alimentacin y el estilo de vida. Qu cambios se pueden realizar en la alimentacin?  Consuma comidas y refrigerios saludables con regularidad. Tenga a mano un refrigerio saludable cuando sienta hambre entre las comidas, tal como una fruta o un puado de nueces.  Como carnes magras y protenas con bajo contenido de grasas saturadas como pollo, pescado, claras de huevo y frijoles. Evite las carnes procesadas.  Coma muchas frutas y verduras, y muchos granos no procesados (granos enteros). Se recomienda que coma: ? 1 a 2 tazas de fruta todos los das. ? 2 a 3 tazas de verduras todos los das. ? 6 a 8 onzas de granos enteros todos los das, como avena, salvado, trigo bulgur, arroz integral, quinoa y mijo.  Coma productos lcteos de bajo contenido graso, como leche, yogur y queso.  Coma alimentos que contengan grasas saludables, como  frutos secos, aguacate, aceite de oliva y aceite de canola.  Beba agua durante todo el da. Evite los lquidos que contengan azcar agregada como gaseosas o t dulce.  Siga las indicaciones de su mdico respecto de las restricciones especficas para las comidas o las bebidas.  Controle la cantidad de alimentos que come por vez (tamao de la porcin). ? Verifique las etiquetas de los alimentos para verificar los tamaos de las porciones de los alimentos. ? Use una balanza de cocina para pesar las porciones de alimentos.  Saltee o hierva al vapor los alimentos en lugar de frerlos. Cocine con agua o caldo en lugar de aceites o manteca.  Limite su consumo de: ? Sal (sodio). No consuma ms de 1 cucharadita (2,400mg) de sodio por da. Si tiene enfermedad cardaca o presin arterial alta, debe consumir menos de  a  cucharadita (1,500mg) de sodio por da. ? Grasas saturadas. Se trata de grasa slida a temperatura ambiente como la manteca o la grasa de la carne. Qu cambios en el estilo de vida se pueden realizar?  Actividad  Haga actividad fsica de intensidad moderada durante al menos 30minutos como mnimo 5das por semana o con la frecuencia que le indique su mdico.  Pregntele al mdico qu actividades son seguras para usted. Una combinacin de actividades puede ser la mejor opcin, por ejemplo, caminar, practicar natacin, andar en bicicleta y hacer entrenamiento de fuerza.  Trate de agregar actividad fsica a su jornada. Por ejemplo: ? Estacione en lugares ms alejados de lo habitual para tener que caminar ms. Por ejemplo, estacione   en una equina alejada del estacionamiento cuando vaya a la oficina o a la tienda de comestibles. ? Vaya a caminar durante su hora de almuerzo. ? Utilice las escaleras en lugar de ascensores o escaleras mecnicas. Bajar de peso  Baje de peso segn se le indique. El mdico puede determinar cuntos kilos tiene que bajar y ayudarlo a que adelgace de manera  segura.  Si tiene sobrepeso o es obeso, es posible que se le indique que pierda al menos entre el 5 y el 7% de su peso corporal. Alcohol y tabaco   Limite el consumo de alcohol a no ms de 1 medida por da si es mujer y no est embarazada y a 2 medidas por da si es hombre. Una medida equivale a 12onzas de cerveza, 5onzas de vino o 1onzas de bebidas alcohlicas de alta graduacin.  No consuma ningn producto que contenga tabaco, lo que incluye cigarrillos, tabaco de mascar y cigarrillos electrnicos. Si necesita ayuda para dejar de fumar, consulte al mdico. Trabaje con su mdico  Contrlese la glucemia de manera regular, segn lo indicado por su mdico.  Analice sus factores de riesgo y cmo puede reducir su riesgo de padecer diabetes.  Somtase a pruebas de deteccin segn lo indicado por su mdico. Puede realizarse pruebas de deteccin con regularidad, especialmente si tiene ciertos factores de riesgo de padecer diabetes tipo 2.  Haga una cita con un especialista en dietas y nutricin (nutricionista matriculado). Un nutricionista matriculado puede ayudarlo a crear un plan de alimentacin saludable y a comprender los tamaos de las porciones y las etiquetas de los alimentos. Por qu son importantes estos cambios?  Es posible prevenir o retrasar la diabetes tipo 2 y los problemas de salud relacionados al realizar cambios en el estilo de vida y la alimentacin.  Puede ser difcil reconocer los signos de la diabetes tipo 2. La mejor manera de evitar posibles daos en el cuerpo es tomar medidas para prevenir la enfermedad antes que usted desarrolle los sntomas. Qu puede suceder si no se realizan cambios?  Sus niveles de glucemia pueden continuar aumentando. Tener la glucemia alta durante mucho tiempo es peligroso. Demasiada glucosa en la sangre puede daar los vasos sanguneos, el corazn, los riones, los nervios y los ojos.  Puede desarrollar prediabetes o diabetes tipo 2. La  diabetes tipo 2 puede ocasionar muchos problemas de salud crnicos y complicaciones, tales como: ? Enfermedad cardaca. ? Accidente cerebrovascular. ? Ceguera. ? Enfermedad renal. ? Depresin. ? Circulacin deficiente en los pies y las piernas, lo cual podra ocasionar una extirpacin quirrgica (amputacin) en casos graves. Dnde encontrar apoyo:  Pida a su mdico que le recomiende un nutricionista matriculado, un instructor en diabetes o un programa para bajar de peso.  Busque grupos de apoyo para bajar de peso a nivel local o en lnea.  Asista a un gimnasio, club de acondicionamiento fsico o nase a un grupo que realice actividad fsica al aire libre como un club de caminata. Dnde encontrar ms informacin: Para obtener ms informacin sobre la diabetes y la prevencin de la diabetes, visite:  Asociacin Americana de la Diabetes (American Diabetes Association, ADA): www.diabetes.org  Instituto Nacional de la Diabetes y las Enfermedades Digestivas y Renales (National Institute of Diabetes and Digestive and Kidney Diseases): www.niddk.nih.gov/health-information/diabetes  Para obtener ms informacin sobre alimentacin saludable, visite:  Departamento de Agricultura de los EE.[place]., Mi plato (U.S. Department of Agriculture, USDA, Choose My Plate): www.choosemyplate.gov/food-groups  Oficina para la Prevencin de Enfermedades y Promocin de Salud, [place]   de alimentacin (Office of Disease Prevention and Health Promotion, ODPHP, Dietary Guidelines): www.health.gov/dietaryguidelines  Resumen  Puede reducir su riesgo de padecer diabetes tipo 2 al aumentar su actividad fsica, comer alimentos saludables y bajar de peso segn se lo indiquen.  Hable con su mdico sobre su riesgo de padecer diabetes tipo 2. Pregunte sobre los anlisis de sangre o pruebas de deteccin que debe realizarse. Esta informacin no tiene como fin reemplazar el consejo del mdico. Asegrese de hacerle al mdico  cualquier pregunta que tenga. Document Released: 11/19/2015 Document Revised: 01/18/2017 Document Reviewed: 11/19/2015 Elsevier Interactive Patient Education  2018 Elsevier Inc.     IF you received an x-ray today, you will receive an invoice from [facility]. Please contact [facility] at [phone] with questions or concerns regarding your invoice.   IF you received labwork today, you will receive an invoice from [facility]. Please contact [facility] at [phone] with questions or concerns regarding your invoice.   Our billing staff will not be able to assist you with questions regarding bills from these companies.  You will be contacted with the lab results as soon as they are available. The fastest way to get your results is to activate your My Chart account. Instructions are located on the last page of this paperwork. If you have not heard from us regarding the results in 2 weeks, please contact this office.      

## 2018-03-15 NOTE — Addendum Note (Signed)
Addended by: Wallis BambergMANI, Ashawnti Tangen on: 03/15/2018 02:25 PM   Modules accepted: Orders

## 2018-03-16 LAB — CBC
HEMATOCRIT: 46.2 % (ref 37.5–51.0)
Hemoglobin: 16.3 g/dL (ref 13.0–17.7)
MCH: 33.7 pg — ABNORMAL HIGH (ref 26.6–33.0)
MCHC: 35.3 g/dL (ref 31.5–35.7)
MCV: 96 fL (ref 79–97)
Platelets: 113 10*3/uL — ABNORMAL LOW (ref 150–450)
RBC: 4.83 x10E6/uL (ref 4.14–5.80)
RDW: 13.3 % (ref 12.3–15.4)
WBC: 5.1 10*3/uL (ref 3.4–10.8)

## 2018-03-16 LAB — COMPREHENSIVE METABOLIC PANEL
ALBUMIN: 4.4 g/dL (ref 3.5–5.5)
ALK PHOS: 106 IU/L (ref 39–117)
ALT: 83 IU/L — ABNORMAL HIGH (ref 0–44)
AST: 91 IU/L — ABNORMAL HIGH (ref 0–40)
Albumin/Globulin Ratio: 1.2 (ref 1.2–2.2)
BILIRUBIN TOTAL: 0.8 mg/dL (ref 0.0–1.2)
BUN / CREAT RATIO: 21 — AB (ref 9–20)
BUN: 17 mg/dL (ref 6–24)
CHLORIDE: 104 mmol/L (ref 96–106)
CO2: 19 mmol/L — ABNORMAL LOW (ref 20–29)
Calcium: 9.1 mg/dL (ref 8.7–10.2)
Creatinine, Ser: 0.82 mg/dL (ref 0.76–1.27)
GFR calc Af Amer: 128 mL/min/{1.73_m2} (ref >59)
GFR calc non Af Amer: 111 mL/min/{1.73_m2} (ref >59)
GLOBULIN, TOTAL: 3.8 g/dL (ref 1.5–4.5)
GLUCOSE: 104 mg/dL — AB (ref 65–99)
POTASSIUM: 3.9 mmol/L (ref 3.5–5.2)
SODIUM: 139 mmol/L (ref 134–144)
Total Protein: 8.2 g/dL (ref 6.0–8.5)

## 2018-03-16 LAB — HEMOGLOBIN A1C
Est. average glucose Bld gHb Est-mCnc: 105 mg/dL
HEMOGLOBIN A1C: 5.3 % (ref 4.8–5.6)

## 2018-03-16 LAB — THYROID PANEL WITH TSH
Free Thyroxine Index: 2.3 (ref 1.2–4.9)
T3 Uptake Ratio: 23 % — ABNORMAL LOW (ref 24–39)
T4, Total: 9.9 ug/dL (ref 4.5–12.0)
TSH: 2.01 u[IU]/mL (ref 0.450–4.500)

## 2018-03-17 DIAGNOSIS — R945 Abnormal results of liver function studies: Secondary | ICD-10-CM | POA: Diagnosis not present

## 2018-03-17 NOTE — Addendum Note (Signed)
Addended by: Rogelia RohrerMCADOO, Kamila Broda K on: 03/17/2018 03:43 PM   Modules accepted: Orders

## 2018-03-18 LAB — HEPATITIS C ANTIBODY

## 2018-03-21 LAB — PATHOLOGIST SMEAR REVIEW
BASOS: 0 %
Basophils Absolute: 0 10*3/uL (ref 0.0–0.2)
EOS (ABSOLUTE): 0.1 10*3/uL (ref 0.0–0.4)
EOS: 2 %
Hematocrit: 47.3 % (ref 37.5–51.0)
Hemoglobin: 16 g/dL (ref 13.0–17.7)
IMMATURE GRANS (ABS): 0 10*3/uL (ref 0.0–0.1)
Immature Granulocytes: 0 %
LYMPHS ABS: 1.4 10*3/uL (ref 0.7–3.1)
Lymphs: 27 %
MCH: 33.3 pg — AB (ref 26.6–33.0)
MCHC: 33.8 g/dL (ref 31.5–35.7)
MCV: 98 fL — ABNORMAL HIGH (ref 79–97)
MONOCYTES: 6 %
Monocytes Absolute: 0.3 10*3/uL (ref 0.1–0.9)
Neutrophils Absolute: 3.3 10*3/uL (ref 1.4–7.0)
Neutrophils: 65 %
Platelets: 101 10*3/uL — ABNORMAL LOW (ref 150–450)
RBC: 4.81 x10E6/uL (ref 4.14–5.80)
RDW: 14 % (ref 12.3–15.4)
WBC: 5.1 10*3/uL (ref 3.4–10.8)

## 2018-03-21 LAB — HEPATITIS B SURFACE ANTIGEN: Hepatitis B Surface Ag: NEGATIVE

## 2018-03-21 LAB — SPECIMEN STATUS REPORT

## 2018-04-06 ENCOUNTER — Encounter: Payer: Self-pay | Admitting: Emergency Medicine

## 2018-04-06 ENCOUNTER — Other Ambulatory Visit: Payer: Self-pay

## 2018-04-06 ENCOUNTER — Ambulatory Visit: Payer: 59 | Admitting: Emergency Medicine

## 2018-04-06 VITALS — BP 116/62 | HR 93 | Temp 98.4°F | Resp 16 | Ht 68.0 in | Wt 188.6 lb

## 2018-04-06 DIAGNOSIS — I10 Essential (primary) hypertension: Secondary | ICD-10-CM | POA: Insufficient documentation

## 2018-04-06 NOTE — Patient Instructions (Addendum)
   IF you received an x-ray today, you will receive an invoice from [facility]. Please contact [facility] at [phone] with questions or concerns regarding your invoice.   IF you received labwork today, you will receive an invoice from [facility]. Please contact [facility] at [phone] with questions or concerns regarding your invoice.   Our billing staff will not be able to assist you with questions regarding bills from these companies.  You will be contacted with the lab results as soon as they are available. The fastest way to get your results is to activate your My Chart account. Instructions are located on the last page of this paperwork. If you have not heard from us regarding the results in 2 weeks, please contact this office.     Hipertensin Hypertension El trmino hipertensin es otra forma de denominar a la presin arterial elevada. La presin arterial elevada fuerza al corazn a trabajar ms para bombear la sangre. Esto puede causar problemas con el paso del tiempo. Una lectura de presin arterial est compuesta por 2 nmeros. Hay un nmero superior (sistlico) sobre un nmero inferior (diastlico). Lo ideal es tener la presin arterial por debajo de 120/80. Las decisiones saludables pueden ayudarle a disminuir su presin arterial. Es posible que necesite medicamentos que le ayuden a disminuir su presin arterial si:  Su presin arterial no disminuye mediante decisiones saludables.  Su presin arterial est por encima de 130/80.  Siga estas instrucciones en su casa: Comida y bebida  Si se lo indican, siga el plan de alimentacin de DASH (Dietary Approaches to Stop Hypertension, Maneras de alimentarse para detener la hipertensin). Esta dieta incluye: ? Que la mitad del plato de cada comida sea de frutas y verduras. ? Que un cuarto del plato de cada comida sea de cereales integrales. Los cereales integrales incluyen pasta integral, arroz integral y pan  integral. ? Comer y beber productos lcteos con bajo contenido de grasa, como leche descremada o yogur bajo en grasas. ? Que un cuarto del plato de cada comida sea de protenas bajas en grasa (magras). Las protenas bajas en grasa incluyen pescado, pollo sin piel, huevos, frijoles y tofu. ? Evitar consumir carne grasa, carne curada y procesada, o pollo con piel. ? Evitar consumir alimentos prehechos o procesados.  Consuma menos de 1500 mg de sal (sodio) por da.  Limite el consumo de alcohol a no ms de 1 medida por da si es mujer y no est embarazada y a 2 medidas por da si es hombre. Una medida equivale a 12onzas de cerveza, 5onzas de vino o 1onzas de bebidas alcohlicas de alta graduacin. Estilo de vida  Trabaje con su mdico para mantenerse en un peso saludable o para perder peso. Pregntele a su mdico cul es el peso recomendable para usted.  Realice al menos 30 minutos de ejercicio que haga que se acelere su corazn (ejercicio aerbico) la mayora de los das de la semana. Estos pueden incluir caminar, nadar o andar en bicicleta.  Realice al menos 30 minutos de ejercicio que fortalezca sus msculos (ejercicios de resistencia) al menos 3 das a la semana. Estos pueden incluir levantar pesas o hacer pilates.  No consuma ningn producto que contenga nicotina o tabaco. Esto incluye cigarrillos y cigarrillos electrnicos. Si necesita ayuda para dejar de fumar, consulte al mdico.  Controle su presin arterial en su casa tal como le indic el mdico.  Concurra a todas las visitas de control como se lo haya indicado el mdico. Esto es   importante. Medicamentos  Tome los medicamentos de venta libre y los recetados solamente como se lo haya indicado el mdico. Siga cuidadosamente las indicaciones.  No omita las dosis de medicamentos para la presin arterial. Los medicamentos pierden eficacia si omite dosis. El hecho de omitir las dosis tambin aumenta el riesgo de otros  problemas.  Pregntele a su mdico a qu efectos secundarios o reacciones a los medicamentos debe prestar atencin. Comunquese con un mdico si:  Piensa que tiene una reaccin a los medicamentos que est tomando.  Tiene dolores de cabeza frecuentes (recurrentes).  Siente mareos.  Tiene hinchazn en los tobillos.  Tiene problemas de visin. Solicite ayuda de inmediato si:  Siente un dolor de cabeza muy intenso.  Comienza a sentirse confundido.  Se siente dbil o adormecido.  Siente que va a desmayarse.  Siente un dolor muy intenso en: ? El pecho. ? El vientre (abdomen).  Devuelve (vomita) ms de una vez.  Tiene dificultad para respirar. Resumen  El trmino hipertensin es otra forma de denominar a la presin arterial elevada.  Las decisiones saludables pueden ayudarle a disminuir su presin arterial. Si no puede controlar su presin arterial mediante decisiones saludables, es posible que deba tomar medicamentos. Esta informacin no tiene como fin reemplazar el consejo del mdico. Asegrese de hacerle al mdico cualquier pregunta que tenga. Document Released: 03/18/2010 Document Revised: 09/09/2016 Document Reviewed: 09/09/2016 Elsevier Interactive Patient Education  2018 Elsevier Inc.  

## 2018-04-06 NOTE — Progress Notes (Signed)
[name] [name] 42 y.o.   Chief Complaint  Patient presents with  . Hypertension    follow up   . Diabetes    HISTORY OF PRESENT ILLNESS: This is a 42 y.o. male here for follow-up of hypertension and diabetes.  Seen here on 03/15/2018 by PA- Urban Gibson.  Blood work was negative for diabetes.  Mild elevation of liver enzymes.  Negative hepatitis B and C work-up.  Low platelets.  Started on lisinopril 10 mg daily for blood pressure.  Compliant with medication.  Doing well and tolerating it well.  Gets occasional tension headaches but no other significant symptoms.  HPI   Prior to Admission medications   Medication Sig Start Date End Date Taking? Authorizing Provider  lisinopril (PRINIVIL,ZESTRIL) 10 MG tablet Take 1 tablet (10 mg total) by mouth daily. 03/15/18  Yes Wallis Bamberg, PA-C    No Known Allergies  There are no active problems to display for this patient.   No past medical history on file.  No past surgical history on file.  Social History   Socioeconomic History  . Marital status: Married    Spouse name: Not on file  . Number of children: Not on file  . Years of education: Not on file  . Highest education level: Not on file  Occupational History  . Not on file  Social Needs  . Financial resource strain: Not on file  . Food insecurity:    Worry: Not on file    Inability: Not on file  . Transportation needs:    Medical: Not on file    Non-medical: Not on file  Tobacco Use  . Smoking status: Never Smoker  . Smokeless tobacco: Never Used  Substance and Sexual Activity  . Alcohol use: Yes    Alcohol/week: 0.0 oz  . Drug use: No  . Sexual activity: Not on file  Lifestyle  . Physical activity:    Days per week: Not on file    Minutes per session: Not on file  . Stress: Not on file  Relationships  . Social connections:    Talks on phone: Not on file    Gets together: Not on file    Attends religious service: Not on file    Active member of club or organization:  Not on file    Attends meetings of clubs or organizations: Not on file    Relationship status: Not on file  . Intimate partner violence:    Fear of current or ex partner: Not on file    Emotionally abused: Not on file    Physically abused: Not on file    Forced sexual activity: Not on file  Other Topics Concern  . Not on file  Social History Narrative  . Not on file    No family history on file.   Review of Systems  Constitutional: Negative.  Negative for chills and fever.  HENT: Negative.  Negative for sore throat.   Eyes: Negative.  Negative for blurred vision and double vision.  Respiratory: Negative.  Negative for cough and shortness of breath.   Cardiovascular: Negative.  Negative for chest pain and palpitations.  Gastrointestinal: Negative.  Negative for abdominal pain, diarrhea, nausea and vomiting.  Genitourinary: Negative.  Negative for hematuria.  Musculoskeletal: Negative.  Negative for back pain, myalgias and neck pain.  Skin: Negative.   Neurological: Positive for headaches. Negative for dizziness and loss of consciousness.  Endo/Heme/Allergies: Negative.   All other systems reviewed and are negative.  Vitals:   04/06/18 1402  BP: 116/62  Pulse: 93  Resp: 16  Temp: 98.4 F (36.9 C)  SpO2: 96%    Physical Exam  Constitutional: He is oriented to person, place, and time. He appears well-developed and well-nourished.  HENT:  Head: Normocephalic and atraumatic.  Nose: Nose normal.  Mouth/Throat: Oropharynx is clear and moist.  Eyes: Pupils are equal, round, and reactive to light. Conjunctivae and EOM are normal.  Neck: Normal range of motion. Neck supple. No JVD present.  Cardiovascular: Normal rate, regular rhythm and normal heart sounds.  Pulmonary/Chest: Effort normal and breath sounds normal.  Musculoskeletal: Normal range of motion. He exhibits no edema or tenderness.  Lymphadenopathy:    He has no cervical adenopathy.  Neurological: He is alert  and oriented to person, place, and time. No cranial nerve deficit or sensory deficit. He exhibits normal muscle tone. Coordination normal.  Skin: Skin is warm and dry. Capillary refill takes less than 2 seconds.  Psychiatric: He has a normal mood and affect. His behavior is normal.  Vitals reviewed.  A total of 25 minutes was spent in the room with the patient, greater than 50% of which was in counseling/coordination of care regarding chronic medical problems, treatment, diet and nutrition, medications, need for follow-up.   Essential hypertension Blood pressure well controlled.  Tolerating medication well.  Will continue lisinopril 10 mg a day and follow-up in 3 months.   ASSESSMENT & PLAN: Athony was seen today for hypertension and diabetes.  Diagnoses and all orders for this visit:  Essential hypertension    Patient Instructions       IF you received an x-ray today, you will receive an invoice from [facility]. Please contact [facility] at [phone] with questions or concerns regarding your invoice.   IF you received labwork today, you will receive an invoice from [facility]. Please contact [facility] at [phone] with questions or concerns regarding your invoice.   Our billing staff will not be able to assist you with questions regarding bills from these companies.  You will be contacted with the lab results as soon as they are available. The fastest way to get your results is to activate your My Chart account. Instructions are located on the last page of this paperwork. If you have not heard from [place] regarding the results in 2 weeks, please contact this office.     Hipertensin Hypertension El trmino hipertensin es otra forma de denominar a la presin arterial elevada. La presin arterial elevada fuerza al corazn a trabajar ms para bombear la sangre. Esto puede causar problemas con el paso del Oriental. Una lectura de presin arterial est compuesta por  2 nmeros. Hay un nmero superior (sistlico) sobre un nmero inferior (diastlico). Lo ideal es tener la presin arterial por debajo de 120/80. Las decisiones saludables pueden ayudarle a disminuir su presin arterial. Es posible que necesite medicamentos que le ayuden a disminuir su presin arterial si:  Su presin arterial no disminuye mediante decisiones saludables.  Su presin arterial est por encima de 130/80.  Siga estas instrucciones en su casa: Comida y bebida  Si se lo indican, siga el plan de alimentacin de DASH (Dietary Approaches to Stop Hypertension, Maneras de alimentarse para detener la hipertensin). Esta dieta incluye: ? Que la mitad del plato de cada comida sea de frutas y verduras. ? Que un cuarto del plato de cada comida sea de cereales integrales. Los cereales integrales incluyen pasta integral, arroz integral y pan integral. ?  Comer y beber productos lcteos con bajo contenido de Old Agencygrasa, como leche descremada o yogur bajo en grasas. ? Que un cuarto del plato de cada comida sea de protenas bajas en grasa (magras). Las protenas bajas en grasa incluyen pescado, pollo sin piel, huevos, frijoles y tofu. ? Evitar consumir carne grasa, carne curada y procesada, o pollo con piel. ? Evitar consumir alimentos prehechos o procesados.  Consuma menos de 1500 mg de sal (sodio) por da.  Limite el consumo de alcohol a no ms de 1 medida por da si es mujer y no est Orthoptistembarazada y a 2 medidas por da si es hombre. Una medida equivale a 12onzas de cerveza, 5onzas de vino o 1onzas de bebidas alcohlicas de alta graduacin. Estilo de vida  Trabaje con su mdico para mantenerse en un peso saludable o para perder peso. Pregntele a su mdico cul es el peso recomendable para usted.  Realice al menos 30 minutos de ejercicio que haga que se acelere su corazn (ejercicio Magazine features editoraerbico) la DIRECTVmayora de los das de la [place]man[place]. Estos pueden incluir caminar, nadar o andar en bicicleta.  Realice al  menos 30 minutos de ejercicio que fortalezca sus msculos (ejercicios de resistencia) al menos 3 das a la [place]semana. Estos pueden incluir levantar pesas o hacer pilates.  No consuma ningn producto que contenga nicotina o tabaco. Esto incluye cigarrillos y cigarrillos electrnicos. Si necesita ayuda para dejar de fumar, consulte al American Expressmdico.  Controle su presin arterial en su casa tal como le indic el mdico.  Concurra a todas las visitas de control como se lo haya indicado el mdico. Esto es importante. Medicamentos  Baxter Internationalome los medicamentos de venta libre y los recetados solamente como se lo haya indicado el mdico. Siga cuidadosamente las indicaciones.  No omita las dosis de medicamentos para la presin arterial. Los medicamentos pierden eficacia si omite dosis. El hecho de omitir las dosis tambin [place]a el riesgo de otros problemas.  Pregntele a su mdico a qu efectos secundarios o reacciones a los Museum/gallery curatormedicamentos debe prestar atencin. Comunquese con un mdico si:  Piensa que tiene [place] reaccin a los medicamentos que est tomando.  Tiene dolores de cabeza frecuentes (recurrentes).  Siente mareos.  Tiene hinchazn en los tobillos.  Tiene problemas de visin. Solicite ayuda de inmediato si:  Siente un dolor de cabeza muy intenso.  Comienza a sentirse confundido.  Se siente dbil o adormecido.  Siente que va a desmayarse.  Siente un dolor muy intenso en: ? El pecho. ? El vientre (abdomen).  Devuelve (vomita) ms de una vez.  Tiene dificultad para respirar. Resumen  El trmino hipertensin es otra forma de denominar a la presin arterial elevada.  Las decisiones saludables pueden ayudarle a disminuir su presin arterial. Si no puede controlar su presin arterial mediante decisiones saludables, es posible que deba tomar medicamentos. Esta informacin no tiene Theme park managercomo fin reemplazar el consejo del mdico. Asegrese de hacerle al mdico cualquier pregunta que tenga. Document  Released: 03/18/2010 Document Revised: 09/09/2016 Document Reviewed: 09/09/2016 Elsevier Interactive Patient Education  2018 Elsevier Inc.      Edwina BarthMiguel Alfie Rideaux, MD [facility]

## 2018-04-06 NOTE — Assessment & Plan Note (Signed)
Blood pressure well controlled.  Tolerating medication well.  Will continue lisinopril 10 mg a day and follow-up in 3 months.

## 2018-04-12 ENCOUNTER — Ambulatory Visit: Payer: 59 | Admitting: Urgent Care

## 2018-07-07 ENCOUNTER — Ambulatory Visit: Payer: 59 | Admitting: Urgent Care

## 2019-06-07 ENCOUNTER — Encounter: Payer: Self-pay | Admitting: Emergency Medicine

## 2019-06-07 ENCOUNTER — Telehealth (INDEPENDENT_AMBULATORY_CARE_PROVIDER_SITE_OTHER): Payer: 59 | Admitting: Emergency Medicine

## 2019-06-07 ENCOUNTER — Other Ambulatory Visit: Payer: Self-pay

## 2019-06-07 DIAGNOSIS — Z20822 Contact with and (suspected) exposure to covid-19: Secondary | ICD-10-CM

## 2019-06-07 DIAGNOSIS — Z20828 Contact with and (suspected) exposure to other viral communicable diseases: Secondary | ICD-10-CM

## 2019-06-07 NOTE — Patient Instructions (Signed)
° ° ° °  If you have lab work done today you will be contacted with your lab results within the next 2 weeks.  If you have not heard from us then please contact us. The fastest way to get your results is to register for My Chart. ° ° °IF you received an x-ray today, you will receive an invoice from [facility]. Please contact  Radiology at [phone] with questions or concerns regarding your invoice.  ° °IF you received labwork today, you will receive an invoice from [facility]. Please contact [facility] at [phone] with questions or concerns regarding your invoice.  ° °Our billing staff will not be able to assist you with questions regarding bills from these companies. ° °You will be contacted with the lab results as soon as they are available. The fastest way to get your results is to activate your My Chart account. Instructions are located on the last page of this paperwork. If you have not heard from us regarding the results in 2 weeks, please contact this office. °  ° ° ° °

## 2019-06-07 NOTE — Progress Notes (Signed)
Telemedicine Encounter- SOAP NOTE Established Patient  This telephone encounter was conducted with the patient's (or proxy's) verbal consent via audio telecommunications: yes/no: Yes Patient was instructed to have this encounter in a suitably private space; and to only have persons present to whom they give permission to participate. In addition, patient identity was confirmed by use of name plus two identifiers (DOB and address).  I discussed the limitations, risks, security and privacy concerns of performing an evaluation and management service by telephone and the availability of in person appointments. I also discussed with the patient that there may be a patient responsible charge related to this service. The patient expressed understanding and agreed to proceed.  I spent a total of TIME; 0 MIN TO 60 MIN: 10 minutes talking with the patient or their proxy.  No chief complaint on file.  Needs COVID test results to return to work. Subjective   [name] [name] is a 43 y.o. male established patient. Telephone visit today requesting COVID testing for work return purposes.  Asymptomatic.  HPI   Patient Active Problem List   Diagnosis Date Noted  . Essential hypertension 04/06/2018    No past medical history on file.  No current outpatient medications on file.   No current facility-administered medications for this visit.     No Known Allergies  Social History   Socioeconomic History  . Marital status: Married    Spouse name: Not on file  . Number of children: Not on file  . Years of education: Not on file  . Highest education level: Not on file  Occupational History  . Not on file  Social Needs  . Financial resource strain: Not on file  . Food insecurity    Worry: Not on file    Inability: Not on file  . Transportation needs    Medical: Not on file    Non-medical: Not on file  Tobacco Use  . Smoking status: Never Smoker  . Smokeless tobacco: Never Used  Substance  and Sexual Activity  . Alcohol use: Yes    Alcohol/week: 0.0 standard drinks  . Drug use: No  . Sexual activity: Not on file  Lifestyle  . Physical activity    Days per week: Not on file    Minutes per session: Not on file  . Stress: Not on file  Relationships  . Social Musicianconnections    Talks on phone: Not on file    Gets together: Not on file    Attends religious service: Not on file    Active member of club or organization: Not on file    Attends meetings of clubs or organizations: Not on file    Relationship status: Not on file  . Intimate partner violence    Fear of current or ex partner: Not on file    Emotionally abused: Not on file    Physically abused: Not on file    Forced sexual activity: Not on file  Other Topics Concern  . Not on file  Social History Narrative  . Not on file    Review of Systems  Constitutional: Negative.  Negative for chills and fever.  HENT: Negative for congestion and sore throat.   Respiratory: Negative.  Negative for cough and shortness of breath.   Cardiovascular: Negative.  Negative for chest pain and palpitations.  Gastrointestinal: Negative for abdominal pain, diarrhea, nausea and vomiting.  Musculoskeletal: Negative for myalgias.  Skin: Negative.  Negative for rash.  Neurological: Negative for  dizziness and headaches.  All other systems reviewed and are negative.   Objective  Alert and oriented x3 in no apparent respiratory distress Vitals as reported by the patient: There were no vitals filed for this visit.  Diagnoses and all orders for this visit:  Exposure to Covid-19 Virus -     Novel Coronavirus, NAA ([facility])      I discussed the assessment and treatment plan with the patient. The patient was provided an opportunity to ask questions and all were answered. The patient agreed with the plan and demonstrated an understanding of the instructions.   The patient was advised to call back or seek an in-person evaluation if  the symptoms worsen or if the condition fails to improve as anticipated.  I provided 10 minutes of non-face-to-face time during this encounter.  Horald Pollen, MD  Primary Care at [facility]

## 2019-06-07 NOTE — Progress Notes (Signed)
Had a Cold and had a covid test, which was neg. He needs another neg test to go back to work.

## 2019-06-08 LAB — NOVEL CORONAVIRUS, NAA: SARS-CoV-2, NAA: NOT DETECTED

## 2019-06-12 ENCOUNTER — Telehealth: Payer: Self-pay | Admitting: Emergency Medicine

## 2019-06-12 NOTE — Telephone Encounter (Signed)
Pt requesting Cb for lab results. Pt needs return to work note Fax [phone] Attn: Freida Busman

## 2019-06-13 ENCOUNTER — Encounter: Payer: Self-pay | Admitting: Registered Nurse

## 2019-06-13 NOTE — Telephone Encounter (Signed)
Pt came in office and collected results

## 2019-06-26 ENCOUNTER — Encounter: Payer: Self-pay | Admitting: Radiology

## 2020-07-01 ENCOUNTER — Ambulatory Visit
Admission: EM | Admit: 2020-07-01 | Discharge: 2020-07-01 | Disposition: A | Discharge status: Home / Self Care | Payer: 59 | Attending: Urgent Care | Admitting: Urgent Care

## 2020-07-01 ENCOUNTER — Encounter: Payer: Self-pay | Admitting: Emergency Medicine

## 2020-07-01 ENCOUNTER — Other Ambulatory Visit: Payer: Self-pay

## 2020-07-01 DIAGNOSIS — L299 Pruritus, unspecified: Secondary | ICD-10-CM

## 2020-07-01 DIAGNOSIS — R21 Rash and other nonspecific skin eruption: Secondary | ICD-10-CM | POA: Diagnosis not present

## 2020-07-01 DIAGNOSIS — L249 Irritant contact dermatitis, unspecified cause: Secondary | ICD-10-CM

## 2020-07-01 MED ORDER — HYDROXYZINE HCL 25 MG PO TABS: 12.5000 mg | ORAL_TABLET | Freq: Three times a day (TID) | ORAL | 0 refills | Status: DC | PRN

## 2020-07-01 MED ORDER — PREDNISONE 10 MG PO TABS: ORAL_TABLET | ORAL | 0 refills | Status: DC

## 2020-07-01 NOTE — Discharge Instructions (Signed)
Prednisona Dia 1-5: Tome 2 pastillas al dia. Dia 6-10: Tome 1 pastillas al dia. Tome medicamento con desayuno.   Use hydroxyzine para la comezon.

## 2020-07-01 NOTE — ED Triage Notes (Signed)
Pt sts rash to left lower leg and rash to right arm with some pain x 2 days

## 2020-07-01 NOTE — ED Provider Notes (Signed)
  [facility]   MRN: [id number] DOB: 1976/06/29  Subjective:   [name] [name] is a 44 y.o. male presenting for 2 day hx of acute onset rash, itching of left lower leg, right arm.  Patient states that symptoms started after he had one of his friends cut a tree down.  States that he has previously reacted to poison ivy but was not this bad.  Has not used medications for relief.  Denies fever, nausea, vomiting, oral or facial swelling, shortness of breath, pain, drainage of pus or bleeding.  Patient has high blood pressure but denies diabetes.  States that he gets his blood sugar checked regularly.  No current facility-administered medications for this encounter. No current outpatient medications on file.   No Known Allergies  History reviewed. No pertinent past medical history.   History reviewed. No pertinent surgical history.  Family History  Family history unknown: Yes    Social History   Tobacco Use  . Smoking status: Never Smoker  . Smokeless tobacco: Never Used  Substance Use Topics  . Alcohol use: Yes    Alcohol/week: 0.0 standard drinks  . Drug use: No    ROS   Objective:   Vitals: BP (!) 133/91 (BP Location: Left Arm)   Pulse 82   Temp 98.5 F (36.9 C) (Oral)   Resp 18   SpO2 98%   Physical Exam Constitutional:      General: He is not in acute distress.    Appearance: Normal appearance. He is well-developed and normal weight. He is not ill-appearing, toxic-appearing or diaphoretic.  HENT:     Head: Normocephalic and atraumatic.     Right Ear: External ear normal.     Left Ear: External ear normal.     Nose: Nose normal.     Mouth/Throat:     Pharynx: Oropharynx is clear.  Eyes:     General: No scleral icterus.       Right eye: No discharge.        Left eye: No discharge.     Extraocular Movements: Extraocular movements intact.     Pupils: Pupils are equal, round, and reactive to light.  Cardiovascular:     Rate and Rhythm: Normal  rate.  Pulmonary:     Effort: Pulmonary effort is normal.  Musculoskeletal:     Cervical back: Normal range of motion.  Skin:    General: Skin is warm and dry.     Findings: Rash (Erythematous patches with excoriations over left lower leg and right arm) present.  Neurological:     Mental Status: He is alert and oriented to person, place, and time.  Psychiatric:        Mood and Affect: Mood normal.        Behavior: Behavior normal.        Thought Content: Thought content normal.        Judgment: Judgment normal.      Assessment and Plan :   PDMP not reviewed this encounter.  1. Rash and nonspecific skin eruption   2. Itching   3. Irritant contact dermatitis, unspecified trigger     We will manage for irritant dermatitis, reaction to poisonous plant with prednisone course, hydroxyzine.  Recommend supportive care otherwise. Counseled patient on potential for adverse effects with medications prescribed/recommended today, ER and return-to-clinic precautions discussed, patient verbalized understanding.    Wallis Bamberg, PA-C 07/01/20 1750

## 2020-07-09 ENCOUNTER — Ambulatory Visit
Admission: EM | Admit: 2020-07-09 | Discharge: 2020-07-09 | Disposition: A | Discharge status: Home / Self Care | Payer: 59 | Attending: Family Medicine | Admitting: Family Medicine

## 2020-07-09 ENCOUNTER — Other Ambulatory Visit: Payer: Self-pay

## 2020-07-09 DIAGNOSIS — L239 Allergic contact dermatitis, unspecified cause: Secondary | ICD-10-CM

## 2020-07-09 MED ORDER — METHYLPREDNISOLONE SODIUM SUCC 125 MG IJ SOLR
80.0000 mg | Freq: Once | INTRAMUSCULAR | Status: AC
  Administered 2020-07-09: 80 mg via INTRAMUSCULAR

## 2020-07-09 MED ORDER — SARNA 0.5-0.5 % EX LOTN: 1.0000 "application " | TOPICAL_LOTION | CUTANEOUS | 0 refills | Status: DC | PRN

## 2020-07-09 NOTE — ED Triage Notes (Signed)
Pt states here on 9/20 for rash and given hydroxyzine and prednisone with minimal relief. C/o itchy rash all over.

## 2020-07-09 NOTE — ED Provider Notes (Signed)
[facility]    CSN: [id number] Arrival date & time: 07/09/20  0945      History   Chief Complaint Chief Complaint  Patient presents with  . Rash    HPI [name] [name] [name] [name] is a 44 y.o. male.   Patient was treated here on 924 contact dermatitis with prednisone and hydroxyzine.  Symptoms have improved but persist.  I suspect the allergic reactions was not suppressed for a long enough period of time which needs to be 10 to 14 days based on natural history.  HPI  History reviewed. No pertinent past medical history.  Patient Active Problem List   Diagnosis Date Noted  . Essential hypertension 04/06/2018    History reviewed. No pertinent surgical history.     Home Medications    Prior to Admission medications   Not on File    Family History Family History  Family history unknown: Yes    Social History Social History   Tobacco Use  . Smoking status: Never Smoker  . Smokeless tobacco: Never Used  Substance Use Topics  . Alcohol use: Yes    Alcohol/week: 0.0 standard drinks  . Drug use: No     Allergies   Patient has no known allergies.   Review of Systems Review of Systems  Skin: Positive for rash.       Rash is widespread on the extremities as well as trunk  All other systems reviewed and are negative.    Physical Exam Triage Vital Signs ED Triage Vitals [07/09/20 1135]  Enc Vitals Group     BP 130/71     Pulse Rate 76     Resp 18     Temp 98.4 F (36.9 C)     Temp Source Oral     SpO2 96 %     Weight      Height      Head Circumference      Peak Flow      Pain Score 0     Pain Loc      Pain Edu?      Excl. in GC?    No data found.  Updated Vital Signs BP 130/71 (BP Location: Left Arm)   Pulse 76   Temp 98.4 F (36.9 C) (Oral)   Resp 18   SpO2 96%   Visual Acuity Right Eye Distance:   Left Eye Distance:   Bilateral Distance:    Right Eye Near:   Left Eye Near:    Bilateral Near:     Physical Exam Vitals and  nursing note reviewed.  Constitutional:      Appearance: Normal appearance.  Skin:    Comments: Multiple rash on extremities and trunk consistent with aging contact dermatitis.  Neurological:     Mental Status: He is alert.      UC Treatments / Results  Labs (all labs ordered are listed, but only abnormal results are displayed) Labs Reviewed - No data to display  EKG   Radiology No results found.  Procedures Procedures (including critical care time)  Medications Ordered in UC Medications - No data to display  Initial Impression / Assessment and Plan / UC Course  I have reviewed the triage vital signs and the nursing notes.  Pertinent labs & imaging results that were available during my care of the patient were reviewed by me and considered in my medical decision making (see chart for details).     Contact dermatitis Final Clinical Impressions(s) / UC  Diagnoses   Final diagnoses:  None   Discharge Instructions   None    ED Prescriptions    None     PDMP not reviewed this encounter.   Frederica Kuster, MD 07/09/20 [phone]

## 2021-05-06 ENCOUNTER — Other Ambulatory Visit: Payer: Self-pay

## 2021-05-06 ENCOUNTER — Ambulatory Visit
Admission: EM | Admit: 2021-05-06 | Discharge: 2021-05-06 | Disposition: A | Discharge status: Home / Self Care | Payer: Self-pay | Attending: Student | Admitting: Student

## 2021-05-06 DIAGNOSIS — S39012A Strain of muscle, fascia and tendon of lower back, initial encounter: Secondary | ICD-10-CM

## 2021-05-06 MED ORDER — TIZANIDINE HCL 2 MG PO TABS: 2.0000 mg | ORAL_TABLET | Freq: Three times a day (TID) | ORAL | 0 refills | Status: DC | PRN

## 2021-05-06 MED ORDER — IBUPROFEN 600 MG PO TABS: 600.0000 mg | ORAL_TABLET | Freq: Four times a day (QID) | ORAL | 0 refills | Status: DC | PRN

## 2021-05-06 NOTE — Discharge Instructions (Addendum)
-  Start the muscle relaxer-Zanaflex (tizanidine), up to 3 times daily for muscle spasms and pain.  This can make you drowsy, so take at bedtime or when you do not need to drive or operate machinery. -I sent a prescription for ibuprofen 600mg . You can take Tylenol 1000 mg 3 times daily, and ibuprofen 600 mg 3 times daily with food.  You can take these together, or alternate every 3-4 hours. -You can continue heating pad/pain patches. Also try gentle range of motion exercises.  -Try to avoid strenuous activity at work while pain persists -If symptoms persist in 4-5 days, you can follow-up with an orthopedist. I recommend [facility] at [address], [place], [place]. You can schedule an appointment by calling [phone]) or online [phone]), but they also have a walk-in clinic M-F 8a-8p and Sat 10a-3p.

## 2021-05-06 NOTE — ED Provider Notes (Signed)
[facility]    CSN: [id number] Arrival date & time: 05/06/21  1700      History   Chief Complaint Chief Complaint  Patient presents with   Back Pain    HPI [name] [name] is a 45 y.o. male presenting with lower back pain x1 week following heavy lifting at work. Medical history noncontributory- denies history similar. Has tried tylenol and "pain patches" on back with minimal improvement. Denies pain shooting down legs, denies numbness in arms/legs, denies weakness in arms/legs, denies saddle anesthesia, denies bowel/bladder incontinence, denies urinary retention, denies constipation.   HPI  History reviewed. No pertinent past medical history.  Patient Active Problem List   Diagnosis Date Noted   Essential hypertension 04/06/2018    History reviewed. No pertinent surgical history.     Home Medications    Prior to Admission medications   Medication Sig Start Date End Date Taking? Authorizing Provider  ibuprofen (ADVIL) 600 MG tablet Take 1 tablet (600 mg total) by mouth every 6 (six) hours as needed. 05/06/21  Yes Rhys Martini, PA-C  tiZANidine (ZANAFLEX) 2 MG tablet Take 1 tablet (2 mg total) by mouth every 8 (eight) hours as needed for muscle spasms. 05/06/21  Yes Rhys Martini, PA-C  camphor-menthol [facility]) lotion Apply 1 application topically as needed for itching. 07/09/20   Frederica Kuster, MD    Family History Family History  Family history unknown: Yes    Social History Social History   Tobacco Use   Smoking status: Never   Smokeless tobacco: Never  Substance Use Topics   Alcohol use: Yes    Alcohol/week: 0.0 standard drinks   Drug use: No     Allergies   Patient has no known allergies.   Review of Systems Review of Systems  Constitutional:  Negative for chills, fever and unexpected weight change.  Respiratory:  Negative for chest tightness and shortness of breath.   Cardiovascular:  Negative for chest pain and palpitations.   Gastrointestinal:  Negative for abdominal pain, diarrhea, nausea and vomiting.  Genitourinary:  Negative for decreased urine volume, difficulty urinating and frequency.  Musculoskeletal:  Positive for back pain. Negative for arthralgias, gait problem, joint swelling, myalgias, neck pain and neck stiffness.  Skin:  Negative for wound.  Neurological:  Negative for dizziness, tremors, seizures, syncope, facial asymmetry, speech difficulty, weakness, light-headedness, numbness and headaches.  All other systems reviewed and are negative.   Physical Exam Triage Vital Signs ED Triage Vitals  Enc Vitals Group     BP 05/06/21 0917 (!) 138/91     Pulse Rate 05/06/21 0917 89     Resp 05/06/21 0917 19     Temp 05/06/21 0917 98 F (36.7 C)     Temp src --      SpO2 05/06/21 0917 95 %     Weight --      Height --      Head Circumference --      Peak Flow --      Pain Score 05/06/21 0916 9     Pain Loc --      Pain Edu? --      Excl. in GC? --    No data found.  Updated Vital Signs BP (!) 138/91   Pulse 89   Temp 98 F (36.7 C)   Resp 19   SpO2 95%   Visual Acuity Right Eye Distance:   Left Eye Distance:   Bilateral Distance:    Right  Eye Near:   Left Eye Near:    Bilateral Near:     Physical Exam Vitals reviewed.  Constitutional:      General: He is not in acute distress.    Appearance: Normal appearance. He is not ill-appearing.  HENT:     Head: Normocephalic and atraumatic.  Cardiovascular:     Rate and Rhythm: Normal rate and regular rhythm.     Heart sounds: Normal heart sounds.  Pulmonary:     Effort: Pulmonary effort is normal.     Breath sounds: Normal breath sounds and air entry.  Abdominal:     Tenderness: There is no abdominal tenderness. There is no right CVA tenderness, left CVA tenderness, guarding or rebound.  Musculoskeletal:     Cervical back: Normal range of motion. No swelling, deformity, signs of trauma, rigidity, spasms, tenderness, bony  tenderness or crepitus. No pain with movement.     Thoracic back: No swelling, deformity, signs of trauma, spasms, tenderness or bony tenderness. Normal range of motion. No scoliosis.     Lumbar back: Spasms and tenderness present. No swelling, deformity, signs of trauma or bony tenderness. Normal range of motion. Negative right straight leg raise test and negative left straight leg raise test. No scoliosis.     Comments: Bilateral lumbar paraspinous muscle tenderness to palpation, pain elicited with extension lumbar spine.  No cervical or thoracic spinous or paraspinous tenderness.  Strength and sensation intact upper and lower extremities, gait intact with pain.  No saddle anesthesia. No midline spinous tenderness, deformity, stepoff.  Absolutely no other injury, deformity, tenderness, ecchymosis, abrasion.  Neurological:     General: No focal deficit present.     Mental Status: He is alert.     Cranial Nerves: No cranial nerve deficit.  Psychiatric:        Mood and Affect: Mood normal.        Behavior: Behavior normal.        Thought Content: Thought content normal.        Judgment: Judgment normal.     UC Treatments / Results  Labs (all labs ordered are listed, but only abnormal results are displayed) Labs Reviewed - No data to display  EKG   Radiology No results found.  Procedures Procedures (including critical care time)  Medications Ordered in UC Medications - No data to display  Initial Impression / Assessment and Plan / UC Course  I have reviewed the triage vital signs and the nursing notes.  Pertinent labs & imaging results that were available during my care of the patient were reviewed by me and considered in my medical decision making (see chart for details).     This patient is a very pleasant 45 y.o. year old male presenting with lumbar strain. No red flag symptoms. Trial of zanaflex, ibuprofen; continue tylenol. Work note provided. F/u with ortho if symptoms  persist. Red flag symptoms and ED return precautions discussed. Patient verbalizes understanding and agreement. He declines translation services as he speaks english.   Final Clinical Impressions(s) / UC Diagnoses   Final diagnoses:  Lumbar strain, initial encounter     Discharge Instructions      -Start the muscle relaxer-Zanaflex (tizanidine), up to 3 times daily for muscle spasms and pain.  This can make you drowsy, so take at bedtime or when you do not need to drive or operate machinery. -I sent a prescription for ibuprofen 600mg . You can take Tylenol 1000 mg 3 times daily, and ibuprofen 600 mg  3 times daily with food.  You can take these together, or alternate every 3-4 hours. -You can continue heating pad/pain patches. Also try gentle range of motion exercises.  -Try to avoid strenuous activity at work while pain persists -If symptoms persist in 4-5 days, you can follow-up with an orthopedist. I recommend [facility] at [address] [place], [place] [address]. You can schedule an appointment by calling [phone]) or online ([email or web address]), but they also have a walk-in clinic M-F 8a-8p and Sat 10a-3p.     ED Prescriptions     Medication Sig Dispense Auth. Provider   tiZANidine (ZANAFLEX) 2 MG tablet Take 1 tablet (2 mg total) by mouth every 8 (eight) hours as needed for muscle spasms. 21 tablet Rhys Martini, PA-C   ibuprofen (ADVIL) 600 MG tablet Take 1 tablet (600 mg total) by mouth every 6 (six) hours as needed. 30 tablet Rhys Martini, PA-C      PDMP not reviewed this encounter.   Rhys Martini, PA-C 05/06/21 [phone]

## 2021-05-06 NOTE — ED Triage Notes (Signed)
Pt presents with back pain x 1 week. Pt has had no relief with medication at home. Pain is in his lower back.

## 2021-05-13 ENCOUNTER — Other Ambulatory Visit: Payer: Self-pay

## 2021-05-13 ENCOUNTER — Ambulatory Visit (INDEPENDENT_AMBULATORY_CARE_PROVIDER_SITE_OTHER): Payer: Self-pay

## 2021-05-13 ENCOUNTER — Ambulatory Visit
Admission: EM | Admit: 2021-05-13 | Discharge: 2021-05-13 | Disposition: A | Discharge status: Home / Self Care | Payer: Self-pay | Attending: Urgent Care | Admitting: Urgent Care

## 2021-05-13 DIAGNOSIS — M545 Low back pain, unspecified: Secondary | ICD-10-CM

## 2021-05-13 DIAGNOSIS — M5136 Other intervertebral disc degeneration, lumbar region: Secondary | ICD-10-CM

## 2021-05-13 DIAGNOSIS — S39012D Strain of muscle, fascia and tendon of lower back, subsequent encounter: Secondary | ICD-10-CM

## 2021-05-13 MED ORDER — PREDNISONE 20 MG PO TABS: ORAL_TABLET | ORAL | 0 refills | Status: DC

## 2021-05-13 NOTE — ED Provider Notes (Signed)
[facility]   MRN: [id number] DOB: November 07, 1975  Subjective:   [name] [name] is a 45 y.o. male presenting for 2-week history of persistent low back pain.  Patient currently rates the pain 7 out of 10 and states that it is mostly constant.  Has had stiffness as well.  Symptoms initially started when he was at work, is required to do a lot of heavy lifting.  He did not feel anything particular at the time however when he went home he started to feel it.  Has been seen here for this already, used ibuprofen and tizanidine consistently.  No new falls or trauma.  No weakness, numbness or tingling.  No radiculopathy.  No history of back problems.  No history of diabetes.  No current facility-administered medications for this encounter.  Current Outpatient Medications:    camphor-menthol (SARNA) lotion, Apply 1 application topically as needed for itching., Disp: 222 mL, Rfl: 0   ibuprofen (ADVIL) 600 MG tablet, Take 1 tablet (600 mg total) by mouth every 6 (six) hours as needed., Disp: 30 tablet, Rfl: 0   tiZANidine (ZANAFLEX) 2 MG tablet, Take 1 tablet (2 mg total) by mouth every 8 (eight) hours as needed for muscle spasms., Disp: 21 tablet, Rfl: 0   No Known Allergies  History reviewed. No pertinent past medical history.   History reviewed. No pertinent surgical history.  Family History  Family history unknown: Yes    Social History   Tobacco Use   Smoking status: Never   Smokeless tobacco: Never  Substance Use Topics   Alcohol use: Yes    Alcohol/week: 0.0 standard drinks   Drug use: No    ROS   Objective:   Vitals: BP (!) 139/91 (BP Location: Left Arm)   Pulse 87   Temp 98.2 F (36.8 C) (Oral)   Resp 18   SpO2 95%   Physical Exam Constitutional:      General: He is not in acute distress.    Appearance: Normal appearance. He is well-developed and normal weight. He is not ill-appearing, toxic-appearing or diaphoretic.  HENT:     Head: Normocephalic and  atraumatic.     Right Ear: External ear normal.     Left Ear: External ear normal.     Nose: Nose normal.     Mouth/Throat:     Pharynx: Oropharynx is clear.  Eyes:     General: No scleral icterus.       Right eye: No discharge.        Left eye: No discharge.     Extraocular Movements: Extraocular movements intact.     Pupils: Pupils are equal, round, and reactive to light.  Cardiovascular:     Rate and Rhythm: Normal rate.  Pulmonary:     Effort: Pulmonary effort is normal.  Musculoskeletal:     Cervical back: Normal range of motion.     Comments: Full range of motion throughout.  Strength 5/5 for lower extremities.  Patient ambulates without any assistance at expected pace.  No ecchymosis, swelling, lacerations or abrasions.  Patient does have midline tenderness along the lower lumbar region.  Slight tenderness of the surrounding paraspinal muscles.  Negative straight leg raise bilaterally.  Neurological:     Mental Status: He is alert and oriented to person, place, and time.     Motor: No weakness.     Coordination: Coordination normal.     Gait: Gait normal.     Deep Tendon Reflexes: Reflexes normal.  Psychiatric:        Mood and Affect: Mood normal.        Behavior: Behavior normal.        Thought Content: Thought content normal.        Judgment: Judgment normal.    DG Lumbar Spine Complete  Result Date: 05/13/2021 CLINICAL DATA:  Worsening low back pain. EXAM: LUMBAR SPINE - COMPLETE 4+ VIEW COMPARISON:  No prior. FINDINGS: Lumbar vertebra numbered the lowest segmented appearing lumbar shaped vertebrae on lateral view as L5. Paraspinal soft tissues are unremarkable. Mild degenerative endplate osteophyte formation L4-L5. No acute bony abnormality. No evidence of fracture. IMPRESSION: Mild L4-L5 degenerative change.  No acute abnormality identified. Electronically Signed   By: Maisie Fus  Register   On: 05/13/2021 09:22     Assessment and Plan :   PDMP not reviewed this  encounter.  1. Acute midline low back pain without sciatica   2. Degenerative disc disease, lumbar   3. Lumbar strain, subsequent encounter     Suspect patient initially strained his back.  Unfortunately, he is not recovering with ibuprofen and tizanidine.  In light of his mild degenerative disease and lack of improvement, offered steroid course. Counseled patient on potential for adverse effects with medications prescribed/recommended today, ER and return-to-clinic precautions discussed, patient verbalized understanding.    Wallis Bamberg, [place] 05/13/21 [phone]

## 2021-05-13 NOTE — ED Triage Notes (Signed)
Pt c/o lower back pain x2 wks. States tx'd here last week with no relief. States pain is now worse. Denies pain radiating.

## 2021-07-02 ENCOUNTER — Encounter: Payer: Self-pay | Admitting: Emergency Medicine

## 2021-07-02 ENCOUNTER — Ambulatory Visit
Admission: EM | Admit: 2021-07-02 | Discharge: 2021-07-02 | Disposition: A | Discharge status: Home / Self Care | Payer: Self-pay | Attending: Urgent Care | Admitting: Urgent Care

## 2021-07-02 ENCOUNTER — Other Ambulatory Visit: Payer: Self-pay

## 2021-07-02 DIAGNOSIS — R21 Rash and other nonspecific skin eruption: Secondary | ICD-10-CM

## 2021-07-02 MED ORDER — BETAMETHASONE DIPROPIONATE 0.05 % EX OINT: TOPICAL_OINTMENT | Freq: Two times a day (BID) | CUTANEOUS | 1 refills | Status: DC

## 2021-07-02 NOTE — ED Provider Notes (Signed)
  [facility]   MRN: [id number] DOB: 08/26/1976  Subjective:   [name] [name] is a 45 y.o. male presenting for 63-month history of persistent waxing and waning rash over the left shin.  Patient has been using multiple home remedies, water and salt.  He is also gotten creams from [place].  Nothing has really helped.  Denies any pain, drainage of pus or bleeding.  Patient is non-smoker, no history of peripheral arterial disease, peripheral vascular disease.  Patient does not have diabetes.  Cannot recall any particular inciting factors.  Denies taking chronic medications.   No Known Allergies  No past medical history on file.   No past surgical history on file.  Family History  Family history unknown: Yes    Social History   Tobacco Use   Smoking status: Never   Smokeless tobacco: Never  Substance Use Topics   Alcohol use: Yes    Alcohol/week: 0.0 standard drinks   Drug use: No    ROS   Objective:   Vitals: BP 128/75 (BP Location: Left Arm)   Pulse 80   Temp 98.2 F (36.8 C) (Oral)   Resp 16   SpO2 96%   Physical Exam Constitutional:      General: He is not in acute distress.    Appearance: Normal appearance. He is well-developed and normal weight. He is not ill-appearing, toxic-appearing or diaphoretic.  HENT:     Head: Normocephalic and atraumatic.     Right Ear: External ear normal.     Left Ear: External ear normal.     Nose: Nose normal.     Mouth/Throat:     Pharynx: Oropharynx is clear.  Eyes:     General: No scleral icterus.       Right eye: No discharge.        Left eye: No discharge.     Extraocular Movements: Extraocular movements intact.     Pupils: Pupils are equal, round, and reactive to light.  Cardiovascular:     Rate and Rhythm: Normal rate.  Pulmonary:     Effort: Pulmonary effort is normal.  Musculoskeletal:     Cervical back: Normal range of motion.  Skin:    Findings: Rash (large patch of a silvery plaque-like rash)  present.  Neurological:     Mental Status: He is alert and oriented to person, place, and time.  Psychiatric:        Mood and Affect: Mood normal.        Behavior: Behavior normal.        Thought Content: Thought content normal.        Judgment: Judgment normal.       Assessment and Plan :   PDMP not reviewed this encounter.  1. Rash and nonspecific skin eruption     High suspicion for psoriatic type rash.  Recommended topical steroids.  Discussed appropriate use of this topical steroid.  Follow-up with dermatology. Counseled patient on potential for adverse effects with medications prescribed/recommended today, ER and return-to-clinic precautions discussed, patient verbalized understanding.    Wallis Bamberg, [place] 07/02/21 [phone]

## 2021-07-02 NOTE — ED Triage Notes (Signed)
Rash to left leg, states it's unknown how long its been there for

## 2021-12-24 ENCOUNTER — Ambulatory Visit
Admission: EM | Admit: 2021-12-24 | Discharge: 2021-12-24 | Disposition: A | Discharge status: Home / Self Care | Payer: Self-pay

## 2021-12-24 ENCOUNTER — Other Ambulatory Visit: Payer: Self-pay

## 2021-12-24 DIAGNOSIS — R21 Rash and other nonspecific skin eruption: Secondary | ICD-10-CM

## 2021-12-24 MED ORDER — PREDNISONE 10 MG (21) PO TBPK: ORAL_TABLET | Freq: Every day | ORAL | 0 refills | Status: DC

## 2021-12-24 NOTE — ED Provider Notes (Signed)
?[facility] ? ? ? ?CSN: [id number] ?Arrival date & time: 12/24/21  0807 ? ? ?  ? ?History   ?Chief Complaint ?Chief Complaint  ?Patient presents with  ? Wound Infection  ? ? ?HPI ?[name] [name] is a 46 y.o. male.  ? ?Patient presents with rash to bilateral lower extremities that has been present for multiple months.  Patient has been seen multiple times for same rash.  He was last seen in September 2022 and prescribed betamethasone steroid cream with minimal improvement.  He was thought to possibly have psoriatic rash.  Denies any recent changes to the environment.  He reports that rash has seemed to worsen.  Denies fevers, body aches, chills.  Rash is itchy and painful at times.  Denies any numbness or tingling to lower extremities. Rash is worse on left leg than right.  ? ? ? ?History reviewed. No pertinent past medical history. ? ?Patient Active Problem List  ? Diagnosis Date Noted  ? Essential hypertension 04/06/2018  ? ? ?History reviewed. No pertinent surgical history. ? ? ? ? ?Home Medications   ? ?Prior to Admission medications   ?Medication Sig Start Date End Date Taking? Authorizing Provider  ?lisinopril (ZESTRIL) 10 MG tablet Take by mouth. 01/16/20  Yes [provider]  ?predniSONE (STERAPRED UNI-PAK 21 TAB) 10 MG (21) TBPK tablet Take by mouth daily. Take 6 tabs by mouth daily  for 2 days, then 5 tabs for 2 days, then 4 tabs for 2 days, then 3 tabs for 2 days, 2 tabs for 2 days, then 1 tab by mouth daily for 2 days 12/24/21  Yes Gustavus Bryant, FNP  ?betamethasone dipropionate (DIPROLENE) 0.05 % ointment Apply topically 2 (two) times daily. 07/02/21   Wallis Bamberg, PA-C  ?camphor-menthol [facility]) lotion Apply 1 application topically as needed for itching. 07/09/20   Frederica Kuster, MD  ?ibuprofen (ADVIL) 600 MG tablet Take 1 tablet (600 mg total) by mouth every 6 (six) hours as needed. 05/06/21   Rhys Martini, PA-C  ?tiZANidine (ZANAFLEX) 2 MG tablet Take 1 tablet (2 mg total) by mouth  every 8 (eight) hours as needed for muscle spasms. 05/06/21   Rhys Martini, PA-C  ? ? ?Family History ?Family History  ?Family history unknown: Yes  ? ? ?Social History ?Social History  ? ?Tobacco Use  ? Smoking status: Never  ? Smokeless tobacco: Never  ?Substance Use Topics  ? Alcohol use: Yes  ?  Alcohol/week: 0.0 standard drinks  ? Drug use: No  ? ? ? ?Allergies   ?Patient has no known allergies. ? ? ?Review of Systems ?Review of Systems ?Per HPI ? ?Physical Exam ?Triage Vital Signs ?ED Triage Vitals  ?Enc Vitals Group  ?   BP 12/24/21 0829 (!) 146/88  ?   Pulse Rate 12/24/21 0829 92  ?   Resp 12/24/21 0829 18  ?   Temp 12/24/21 0829 98.6 ?F (37 ?C)  ?   Temp Source 12/24/21 0829 Oral  ?   SpO2 12/24/21 0829 97 %  ?   Weight --   ?   Height --   ?   Head Circumference --   ?   Peak Flow --   ?   Pain Score 12/24/21 0827 0  ?   Pain Loc --   ?   Pain Edu? --   ?   Excl. in GC? --   ? ?No data found. ? ?Updated Vital Signs ?BP Marland Kitchen)  146/88 (BP Location: Left Arm)   Pulse 92   Temp 98.6 ?F (37 ?C) (Oral)   Resp 18   SpO2 97%  ? ?Visual Acuity ?Right Eye Distance:   ?Left Eye Distance:   ?Bilateral Distance:   ? ?Right Eye Near:   ?Left Eye Near:    ?Bilateral Near:    ? ?Physical Exam ?Constitutional:   ?   General: He is not in acute distress. ?   Appearance: Normal appearance. He is not toxic-appearing or diaphoretic.  ?HENT:  ?   Head: Normocephalic and atraumatic.  ?Eyes:  ?   Extraocular Movements: Extraocular movements intact.  ?   Conjunctiva/sclera: Conjunctivae normal.  ?Pulmonary:  ?   Effort: Pulmonary effort is normal.  ?Skin: ?   Comments: Scaly rash that is spread throughout left shin and right lateral leg.  No purulent drainage or any drainage noted at all.    ?Neurological:  ?   General: No focal deficit present.  ?   Mental Status: He is alert and oriented to person, place, and time. Mental status is at baseline.  ?Psychiatric:     ?   Mood and Affect: Mood normal.     ?   Behavior: Behavior  normal.     ?   Thought Content: Thought content normal.     ?   Judgment: Judgment normal.  ? ? ? ?UC Treatments / Results  ?Labs ?(all labs ordered are listed, but only abnormal results are displayed) ?Labs Reviewed - No data to display ? ?EKG ? ? ?Radiology ?No results found. ? ?Procedures ?Procedures (including critical care time) ? ?Medications Ordered in UC ?Medications - No data to display ? ?Initial Impression / Assessment and Plan / UC Course  ?I have reviewed the triage vital signs and the nursing notes. ? ?Pertinent labs & imaging results that were available during my care of the patient were reviewed by me and considered in my medical decision making (see chart for details). ? ?  ? ?Differential diagnoses include atopic dermatitis versus psoriasis.  Suspect psoriasis given appearance of rash.  Due to extent of rash, will prescribe prednisone steroid taper.  No signs of bacterial infection at this time.  I do think the patient needs to see a dermatologist for further evaluation and management.  Patient was provided with dermatology contact information.  Discussed return precautions.  Patient verbalized understanding and was agreeable with plan.  Interpreter used throughout patient interaction. ?Final Clinical Impressions(s) / UC Diagnoses  ? ?Final diagnoses:  ?Rash and nonspecific skin eruption  ? ? ? ?Discharge Instructions   ? ?  ?You have a rash which is being treated with oral steroids to decrease inflammation.  Please follow-up with provided contact information for dermatology for further evaluation and management. ? ? ? ?ED Prescriptions   ? ? Medication Sig Dispense Auth. Provider  ? predniSONE (STERAPRED UNI-PAK 21 TAB) 10 MG (21) TBPK tablet Take by mouth daily. Take 6 tabs by mouth daily  for 2 days, then 5 tabs for 2 days, then 4 tabs for 2 days, then 3 tabs for 2 days, 2 tabs for 2 days, then 1 tab by mouth daily for 2 days 42 tablet [place], Acie Fredrickson, [place]  ? ?  ? ?PDMP not reviewed this  encounter. ?  ?Gustavus Bryant, [place] ?12/24/21 9323 ? ?

## 2021-12-24 NOTE — Discharge Instructions (Signed)
You have a rash which is being treated with oral steroids to decrease inflammation.  Please follow-up with provided contact information for dermatology for further evaluation and management. ?

## 2021-12-24 NOTE — ED Triage Notes (Signed)
Pt c/o wounds to BLE that are not healing. First noticed many weeks ago states was treated at [facility] w/ a cream without relief. On assessment there are two appreciable wounds to lateral left lower extremity that are raised, red in the center draining clear fluid. The outer edges are white, dry, flaky type of tissue. On the LLE it appears to be the early phase of similar wounds, they are not open or draining clear fluid but very dry, round, and irritated. States he thinks bugs or insects got to him after cutting down a tree.  ?

## 2022-02-18 ENCOUNTER — Encounter (HOSPITAL_COMMUNITY): Payer: Self-pay | Admitting: *Deleted

## 2022-02-18 ENCOUNTER — Other Ambulatory Visit: Payer: Self-pay

## 2022-02-18 ENCOUNTER — Emergency Department (HOSPITAL_COMMUNITY)
Admission: EM | Admit: 2022-02-18 | Discharge: 2022-02-18 | Disposition: A | Discharge status: Home / Self Care | Payer: Self-pay | Attending: Emergency Medicine | Admitting: Emergency Medicine

## 2022-02-18 DIAGNOSIS — Z79899 Other long term (current) drug therapy: Secondary | ICD-10-CM | POA: Insufficient documentation

## 2022-02-18 DIAGNOSIS — L97929 Non-pressure chronic ulcer of unspecified part of left lower leg with unspecified severity: Secondary | ICD-10-CM | POA: Insufficient documentation

## 2022-02-18 DIAGNOSIS — D696 Thrombocytopenia, unspecified: Secondary | ICD-10-CM | POA: Insufficient documentation

## 2022-02-18 DIAGNOSIS — L97909 Non-pressure chronic ulcer of unspecified part of unspecified lower leg with unspecified severity: Secondary | ICD-10-CM

## 2022-02-18 DIAGNOSIS — I10 Essential (primary) hypertension: Secondary | ICD-10-CM | POA: Insufficient documentation

## 2022-02-18 DIAGNOSIS — I776 Arteritis, unspecified: Secondary | ICD-10-CM | POA: Insufficient documentation

## 2022-02-18 HISTORY — DX: Essential (primary) hypertension: I10

## 2022-02-18 LAB — CBC WITH DIFFERENTIAL/PLATELET
Abs Immature Granulocytes: 0.01 10*3/uL (ref 0.00–0.07)
Basophils Absolute: 0 10*3/uL (ref 0.0–0.1)
Basophils Relative: 1 %
Eosinophils Absolute: 0.1 10*3/uL (ref 0.0–0.5)
Eosinophils Relative: 5 %
HCT: 43.7 % (ref 39.0–52.0)
Hemoglobin: 15.3 g/dL (ref 13.0–17.0)
Immature Granulocytes: 0 %
Lymphocytes Relative: 27 %
Lymphs Abs: 0.8 10*3/uL (ref 0.7–4.0)
MCH: 35.5 pg — ABNORMAL HIGH (ref 26.0–34.0)
MCHC: 35 g/dL (ref 30.0–36.0)
MCV: 101.4 fL — ABNORMAL HIGH (ref 80.0–100.0)
Monocytes Absolute: 0.2 10*3/uL (ref 0.1–1.0)
Monocytes Relative: 7 %
Neutro Abs: 1.7 10*3/uL (ref 1.7–7.7)
Neutrophils Relative %: 60 %
Platelets: 43 10*3/uL — ABNORMAL LOW (ref 150–400)
RBC: 4.31 MIL/uL (ref 4.22–5.81)
RDW: 13.6 % (ref 11.5–15.5)
WBC: 2.9 10*3/uL — ABNORMAL LOW (ref 4.0–10.5)
nRBC: 0 % (ref 0.0–0.2)

## 2022-02-18 LAB — COMPREHENSIVE METABOLIC PANEL
ALT: 35 U/L (ref 0–44)
AST: 57 U/L — ABNORMAL HIGH (ref 15–41)
Albumin: 3.1 g/dL — ABNORMAL LOW (ref 3.5–5.0)
Alkaline Phosphatase: 187 U/L — ABNORMAL HIGH (ref 38–126)
Anion gap: 7 (ref 5–15)
BUN: 12 mg/dL (ref 6–20)
CO2: 21 mmol/L — ABNORMAL LOW (ref 22–32)
Calcium: 8 mg/dL — ABNORMAL LOW (ref 8.9–10.3)
Chloride: 110 mmol/L (ref 98–111)
Creatinine, Ser: 0.69 mg/dL (ref 0.61–1.24)
GFR, Estimated: >60 mL/min (ref >60)
Glucose, Bld: 162 mg/dL — ABNORMAL HIGH (ref 70–99)
Potassium: 3.7 mmol/L (ref 3.5–5.1)
Sodium: 138 mmol/L (ref 135–145)
Total Bilirubin: 1.8 mg/dL — ABNORMAL HIGH (ref 0.3–1.2)
Total Protein: 7.9 g/dL (ref 6.5–8.1)

## 2022-02-18 LAB — SEDIMENTATION RATE: Sed Rate: 41 mm/hr — ABNORMAL HIGH (ref 0–16)

## 2022-02-18 MED ORDER — PREDNISONE 20 MG PO TABS: 40.0000 mg | ORAL_TABLET | Freq: Every day | ORAL | 0 refills | Status: AC

## 2022-02-18 MED ORDER — PREDNISONE 20 MG PO TABS
60.0000 mg | ORAL_TABLET | ORAL | Status: AC
  Administered 2022-02-18: 60 mg via ORAL
  Filled 2022-02-18: qty 3

## 2022-02-18 MED ORDER — DOXYCYCLINE HYCLATE 100 MG PO TABS
100.0000 mg | ORAL_TABLET | Freq: Once | ORAL | Status: AC
  Administered 2022-02-18: 100 mg via ORAL
  Filled 2022-02-18: qty 1

## 2022-02-18 MED ORDER — IBUPROFEN 200 MG PO TABS
400.0000 mg | ORAL_TABLET | Freq: Once | ORAL | Status: AC
  Administered 2022-02-18: 400 mg via ORAL
  Filled 2022-02-18: qty 2

## 2022-02-18 MED ORDER — DOXYCYCLINE HYCLATE 100 MG PO CAPS: 100.0000 mg | ORAL_CAPSULE | Freq: Two times a day (BID) | ORAL | 0 refills | Status: DC

## 2022-02-18 MED ORDER — ACETAMINOPHEN 500 MG PO TABS
1000.0000 mg | ORAL_TABLET | Freq: Once | ORAL | Status: AC
  Administered 2022-02-18: 1000 mg via ORAL
  Filled 2022-02-18: qty 2

## 2022-02-18 NOTE — ED Provider Notes (Signed)
?[place] DEPT ?Provider Note ? ? ?CSN: [id number] ?Arrival date & time: 02/18/22  0901 ? ?  ? ?History ? ?Chief Complaint  ?Patient presents with  ? Foot Pain  ? ? ?[name] [name] is a 46 y.o. male. ? ?HPI ?Patient presents with a male companion who assists with the history.  He has a history of hypertension, is concerned about possible diabetes, but presents today due to worsening pain in the left lateral shin.  He has somewhat similar skin changes on right and left, but open wounds on the left that have become more painful in spite of using a salve.  No distal loss of sensation or weakness and he is ambulatory. ? ?He has seen physicians, some other healers, but over the course of the year he has had worsening open wounds on the left lateral aspect and as above, increasing discoloration throughout both calves. ?  ? ?Home Medications ?Prior to Admission medications   ?Medication Sig Start Date End Date Taking? Authorizing Provider  ?betamethasone dipropionate (DIPROLENE) 0.05 % ointment Apply topically 2 (two) times daily. 07/02/21   Jaynee Eagles, PA-C  ?camphor-menthol [facility]) lotion Apply 1 application topically as needed for itching. 07/09/20   Wardell Honour, MD  ?ibuprofen (ADVIL) 600 MG tablet Take 1 tablet (600 mg total) by mouth every 6 (six) hours as needed. 05/06/21   Hazel Sams, PA-C  ?lisinopril (ZESTRIL) 10 MG tablet Take by mouth. 01/16/20   [provider]  ?predniSONE (STERAPRED UNI-PAK 21 TAB) 10 MG (21) TBPK tablet Take by mouth daily. Take 6 tabs by mouth daily  for 2 days, then 5 tabs for 2 days, then 4 tabs for 2 days, then 3 tabs for 2 days, 2 tabs for 2 days, then 1 tab by mouth daily for 2 days 12/24/21   Teodora Medici, FNP  ?tiZANidine (ZANAFLEX) 2 MG tablet Take 1 tablet (2 mg total) by mouth every 8 (eight) hours as needed for muscle spasms. 05/06/21   Hazel Sams, PA-C  ?   ? ?Allergies    ?Patient has no known allergies.   ? ?Review of  Systems   ?Review of Systems  ?Constitutional:   ?     Per HPI, otherwise negative  ?HENT:    ?     Per HPI, otherwise negative  ?Respiratory:    ?     Per HPI, otherwise negative  ?Cardiovascular:   ?     Per HPI, otherwise negative  ?Gastrointestinal:  Negative for vomiting.  ?Endocrine:  ?     Negative aside from HPI  ?Genitourinary:   ?     Neg aside from HPI   ?Musculoskeletal:   ?     Per HPI, otherwise negative  ?Skin:  Positive for wound.  ?Neurological:  Negative for syncope.  ? ?Physical Exam ?Updated Vital Signs ?BP (!) 145/93 (BP Location: Left Arm)   Pulse 95   Temp 98.6 ?F (37 ?C) (Oral)   Resp 17   Ht 5' 6.93" (1.7 m)   Wt 90.7 kg   SpO2 97%   BMI 31.39 kg/m?  ?Physical Exam ?Vitals and nursing note reviewed.  ?Constitutional:   ?   General: He is not in acute distress. ?   Appearance: He is well-developed.  ?HENT:  ?   Head: Normocephalic and atraumatic.  ?Eyes:  ?   Conjunctiva/sclera: Conjunctivae normal.  ?Cardiovascular:  ?   Rate and Rhythm: Normal rate and regular rhythm.  ?  Pulmonary:  ?   Effort: Pulmonary effort is normal. No respiratory distress.  ?   Breath sounds: No stridor.  ?Abdominal:  ?   General: There is no distension.  ?Skin: ?   General: Skin is warm and dry.  ? ?    ?   Comments: Range of motion ankles unremarkable  ?Neurological:  ?   Mental Status: He is alert and oriented to person, place, and time.  ? ? ?ED Results / Procedures / Treatments   ?Labs ?(all labs ordered are listed, but only abnormal results are displayed) ?Labs Reviewed  ?COMPREHENSIVE METABOLIC PANEL - Abnormal; Notable for the following components:  ?    Result Value  ? CO2 21 (*)   ? Glucose, Bld 162 (*)   ? Calcium 8.0 (*)   ? Albumin 3.1 (*)   ? AST 57 (*)   ? Alkaline Phosphatase 187 (*)   ? Total Bilirubin 1.8 (*)   ? All other components within normal limits  ?CBC WITH DIFFERENTIAL/PLATELET - Abnormal; Notable for the following components:  ? WBC 2.9 (*)   ? MCV 101.4 (*)   ? MCH 35.5 (*)   ?  Platelets 43 (*)   ? All other components within normal limits  ?SEDIMENTATION RATE - Abnormal; Notable for the following components:  ? Sed Rate 41 (*)   ? All other components within normal limits  ? ? ?EKG ?None ? ?Radiology ?No results found. ? ?Procedures ?Procedures  ? ? ?Medications Ordered in ED ?Medications  ?ibuprofen (ADVIL) tablet 400 mg (400 mg Oral Given 02/18/22 1013)  ?acetaminophen (TYLENOL) tablet 1,000 mg (1,000 mg Oral Given 02/18/22 1013)  ? ? ?ED Course/ Medical Decision Making/ A&P ?This patient with a Hx of hypertension, possible diabetes presents to the ED for concern of worsening pain around cutaneous wounds on his lateral lower extremity, this involves an extensive number of treatment options, and is a complaint that carries with it a high risk of complications and morbidity.   ? ?The differential diagnosis includes cellulitis, bacteremia, granulomatous disease ? ? ?Social Determinants of Health: ? ?Spanish-speaking, hypertension ? ?Additional history obtained: ? ?Additional history and/or information obtained from family, notable for HPI ? ? ?After the initial evaluation, orders, including: Labs analgesics were initiated. ? ? ?On repeat evaluation of the patient stayed the same ? ?Lab Tests: ? ?I personally interpreted labs.  The pertinent results include: Leukopenia, thrombocytopenia ? ?12:57 PM ?Patient and family aware of all findings.  We discussed his ongoing medication use in the notes that he is not currently taking antihypertensives or any of the prescribed medication, though he was previously advised to do so.  He acknowledges drinking daily, this is likely contributing to his leukopenia and poor healing.  Some suspicion for vasculopathy given his bilateral similar appearance of his legs, but with open wounds as well patient will receive both steroids and antibiotics, referring to our wound care center.  No evidence for bacteremia, sepsis.  Patient encouraged to stop  drinking. ? ? ?Dispostion / Final MDM: ? ?After consideration of the diagnostic results and the patient's response to treatment, as above, patient appropriate for outpatient follow-up. ? 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 ? ?Rx / DC Orders ?ED Discharge Orders   ? ?      Ordered  ?  predniSONE (DELTASONE) 20 MG tablet  Daily with breakfast       ?  02/18/22 1306  ?  doxycycline (VIBRAMYCIN) 100 MG capsule  2 times daily       ? 02/18/22 1306  ? ?  ?  ? ?  ? ? ?  ?Carmin Muskrat, MD ?02/18/22 1307 ? ?

## 2022-02-18 NOTE — Discharge Instructions (Addendum)
It is important to monitor your condition carefully and follow-up with our wound care center.  To aid in the healing of your wounds, please minimize drinking alcohol.  Return here for concerning changes in your condition. ? ?Es Armed forces logistics/support/administrative officer su condici?n cuidadosamente y Radio producer un seguimiento con nuestro centro de cuidado de heridas. Para ayudar en la curaci?n de sus heridas, minimice el consumo de alcohol. Regrese aqu? para Architectural technologist en su condici?n. ?

## 2022-02-18 NOTE — ED Triage Notes (Signed)
Language assist [name] [name] [id number]  ?Pt states he has some type of infection on Left foot, reports pus coming out. Started about a year ago with rash. ?

## 2022-03-15 ENCOUNTER — Encounter (HOSPITAL_COMMUNITY): Payer: Self-pay

## 2022-03-15 ENCOUNTER — Other Ambulatory Visit: Payer: Self-pay

## 2022-03-15 ENCOUNTER — Emergency Department (HOSPITAL_COMMUNITY)
Admission: EM | Admit: 2022-03-15 | Discharge: 2022-03-15 | Disposition: A | Discharge status: Home / Self Care | Payer: Self-pay | Attending: Emergency Medicine | Admitting: Emergency Medicine

## 2022-03-15 DIAGNOSIS — D72819 Decreased white blood cell count, unspecified: Secondary | ICD-10-CM | POA: Insufficient documentation

## 2022-03-15 DIAGNOSIS — Z79899 Other long term (current) drug therapy: Secondary | ICD-10-CM | POA: Insufficient documentation

## 2022-03-15 DIAGNOSIS — R700 Elevated erythrocyte sedimentation rate: Secondary | ICD-10-CM | POA: Insufficient documentation

## 2022-03-15 DIAGNOSIS — I10 Essential (primary) hypertension: Secondary | ICD-10-CM | POA: Insufficient documentation

## 2022-03-15 DIAGNOSIS — X58XXXA Exposure to other specified factors, initial encounter: Secondary | ICD-10-CM | POA: Insufficient documentation

## 2022-03-15 DIAGNOSIS — S81802A Unspecified open wound, left lower leg, initial encounter: Secondary | ICD-10-CM | POA: Insufficient documentation

## 2022-03-15 DIAGNOSIS — D72810 Lymphocytopenia: Secondary | ICD-10-CM | POA: Insufficient documentation

## 2022-03-15 DIAGNOSIS — S81802D Unspecified open wound, left lower leg, subsequent encounter: Secondary | ICD-10-CM

## 2022-03-15 NOTE — ED Provider Notes (Signed)
[place] DEPT Provider Note   CSN: [id number] Arrival date & time: 03/15/22  0559     History  Chief Complaint  Patient presents with   Skin Ulcer    [name] [name] is a 46 y.o. male.  46 year old male presents today for evaluation of ongoing leg wounds.  Per chart review these appear to have started in September.  He states it started as a small spot that after scratching have become worse.  He has history of hypertension.  Has not been diagnosed with diabetes but he has had elevated blood sugars on lab work.  He does not have a PCP.  He states he goes to urgent care to receive his care and refills on his antihypertensive.  He has been seen at urgent care a couple times for the leg wounds as well as in the emergency room 3 weeks ago.  He has been trialed on antibiotics, steroids a couple times with mild improvement in symptoms.  He denies any drainage from the wounds, significant redness, and he is without fever or chills.  He did not follow-up with wound clinic since being discharged from the emergency room 3 weeks ago.  Denies difficulty ambulating, or joint pain.  The history is provided by the patient. A language interpreter was used.      Home Medications Prior to Admission medications   Medication Sig Start Date End Date Taking? Authorizing Provider  doxycycline (VIBRAMYCIN) 100 MG capsule Take 1 capsule (100 mg total) by mouth 2 (two) times daily. 02/18/22   Carmin Muskrat, MD  ibuprofen (ADVIL) 600 MG tablet Take 1 tablet (600 mg total) by mouth every 6 (six) hours as needed. 05/06/21   Hazel Sams, PA-C  lisinopril (ZESTRIL) 10 MG tablet Take by mouth. 01/16/20   [provider]      Allergies    Patient has no known allergies.    Review of Systems   Review of Systems  Constitutional:  Negative for chills and fever.  Musculoskeletal:  Negative for arthralgias.  Skin:  Positive for wound.  All other systems reviewed and are  negative.  Physical Exam Updated Vital Signs BP (!) 166/89 (BP Location: Right Arm)   Pulse 84   Temp 98.7 F (37.1 C) (Oral)   Resp 17   SpO2 99%  Physical Exam Vitals and nursing note reviewed.  Constitutional:      General: He is not in acute distress.    Appearance: Normal appearance. He is not ill-appearing.  HENT:     Head: Normocephalic and atraumatic.     Nose: Nose normal.  Eyes:     General: No scleral icterus.    Extraocular Movements: Extraocular movements intact.     Conjunctiva/sclera: Conjunctivae normal.  Cardiovascular:     Rate and Rhythm: Normal rate and regular rhythm.     Pulses: Normal pulses.  Pulmonary:     Effort: Pulmonary effort is normal. No respiratory distress.     Breath sounds: Normal breath sounds. No wheezing or rales.  Abdominal:     General: There is no distension.     Tenderness: There is no abdominal tenderness.  Musculoskeletal:        General: Normal range of motion.     Cervical back: Normal range of motion.  Skin:    General: Skin is warm and dry.     Comments: Discoloration noted to bilateral lower extremities, worse on the left.  2 open wounds noted to the  left lower extremity.  Please see attached images.  No signs of acute infection.  Neurological:     General: No focal deficit present.     Mental Status: He is alert. Mental status is at baseline.    ED Results / Procedures / Treatments   Labs (all labs ordered are listed, but only abnormal results are displayed) Labs Reviewed - No data to display  EKG None  Radiology No results found.  Procedures Procedures    Medications Ordered in ED Medications - No data to display  ED Course/ Medical Decision Making/ A&P                           Medical Decision Making  Patient presents today for evaluation of chronic wounds.  Has not followed up with wound clinic.  Has been trialed on prednisone course a couple times, and recently given doxycycline.  Had minimal  improvement in symptoms.  He is without any signs of infection today.  Afebrile.  Wound without drainage.  No indication for acute management.  Recent lab work reviewed which showed leukopenia, lymphocytopenia.  Mildly elevated ESR.  No indication to repeat blood work today.  Referral to wound clinic, [place] [facility] for establishing a PCP, and dermatology referral.  Patient is in agreement with plan.  Return precautions discussed. Discussed with attending.    Final Clinical Impression(s) / ED Diagnoses Final diagnoses:  Wound of left lower extremity, subsequent encounter    Rx / DC Orders ED Discharge Orders     None         Evlyn Courier, PA-C 03/15/22 [place], [place], DO 03/15/22 1432

## 2022-03-15 NOTE — Discharge Instructions (Addendum)
Your exam today does not show any evidence of infection.  I have attached a referral to wound clinic above.  Please ensure that you call them tomorrow to schedule a follow-up appointment for ongoing management of your leg wounds.  Given you have had elevated blood sugars in the past we will not repeat a course of steroids as this can worsen your blood sugars and make wound healing difficult.  I have also attached a referral to [facility] community health and wellness clinic for you to establish primary care with.  I have also listed a dermatology center above that she can call and follow-up with.  If you have any fever, worsening symptoms please return to the emergency room.

## 2022-03-15 NOTE — ED Triage Notes (Addendum)
Patient said for 2 weeks he has had skin breakdown in his left calf. He said it wont go away and it hurts bad. 2 ulcers that are dry and black. He said he took antibiotics for them but did not improve. He does not know how he got the ulcers but they started as a pimple. Said he is not diabetic. Ambulatory in triage.

## 2022-03-15 NOTE — ED Notes (Signed)
D/c instructions reviewed using spanish interpreter.

## 2022-03-17 ENCOUNTER — Other Ambulatory Visit (HOSPITAL_BASED_OUTPATIENT_CLINIC_OR_DEPARTMENT_OTHER): Payer: Self-pay | Admitting: Internal Medicine

## 2022-03-17 ENCOUNTER — Encounter (HOSPITAL_BASED_OUTPATIENT_CLINIC_OR_DEPARTMENT_OTHER): Payer: Self-pay | Attending: Internal Medicine | Admitting: Internal Medicine

## 2022-03-17 ENCOUNTER — Encounter (HOSPITAL_COMMUNITY): Payer: Self-pay

## 2022-03-17 ENCOUNTER — Emergency Department (HOSPITAL_COMMUNITY)
Admission: EM | Admit: 2022-03-17 | Discharge: 2022-03-17 | Disposition: A | Discharge status: Home / Self Care | Payer: Self-pay | Attending: Emergency Medicine | Admitting: Emergency Medicine

## 2022-03-17 DIAGNOSIS — E11621 Type 2 diabetes mellitus with foot ulcer: Secondary | ICD-10-CM | POA: Insufficient documentation

## 2022-03-17 DIAGNOSIS — I10 Essential (primary) hypertension: Secondary | ICD-10-CM | POA: Insufficient documentation

## 2022-03-17 DIAGNOSIS — X58XXXD Exposure to other specified factors, subsequent encounter: Secondary | ICD-10-CM | POA: Insufficient documentation

## 2022-03-17 DIAGNOSIS — L97828 Non-pressure chronic ulcer of other part of left lower leg with other specified severity: Secondary | ICD-10-CM | POA: Insufficient documentation

## 2022-03-17 DIAGNOSIS — S81802D Unspecified open wound, left lower leg, subsequent encounter: Secondary | ICD-10-CM | POA: Insufficient documentation

## 2022-03-17 DIAGNOSIS — I87312 Chronic venous hypertension (idiopathic) with ulcer of left lower extremity: Secondary | ICD-10-CM | POA: Insufficient documentation

## 2022-03-17 LAB — CBC WITH DIFFERENTIAL/PLATELET
Abs Immature Granulocytes: 0 10*3/uL (ref 0.00–0.07)
Basophils Absolute: 0 10*3/uL (ref 0.0–0.1)
Basophils Relative: 1 %
Eosinophils Absolute: 0.2 10*3/uL (ref 0.0–0.5)
Eosinophils Relative: 5 %
HCT: 42 % (ref 39.0–52.0)
Hemoglobin: 14.5 g/dL (ref 13.0–17.0)
Immature Granulocytes: 0 %
Lymphocytes Relative: 33 %
Lymphs Abs: 1.5 10*3/uL (ref 0.7–4.0)
MCH: 34.4 pg — ABNORMAL HIGH (ref 26.0–34.0)
MCHC: 34.5 g/dL (ref 30.0–36.0)
MCV: 99.5 fL (ref 80.0–100.0)
Monocytes Absolute: 0.4 10*3/uL (ref 0.1–1.0)
Monocytes Relative: 8 %
Neutro Abs: 2.3 10*3/uL (ref 1.7–7.7)
Neutrophils Relative %: 53 %
Platelets: 45 10*3/uL — ABNORMAL LOW (ref 150–400)
RBC: 4.22 MIL/uL (ref 4.22–5.81)
RDW: 13 % (ref 11.5–15.5)
WBC: 4.4 10*3/uL (ref 4.0–10.5)
nRBC: 0 % (ref 0.0–0.2)

## 2022-03-17 LAB — BASIC METABOLIC PANEL
Anion gap: 7 (ref 5–15)
BUN: 11 mg/dL (ref 6–20)
CO2: 22 mmol/L (ref 22–32)
Calcium: 8.5 mg/dL — ABNORMAL LOW (ref 8.9–10.3)
Chloride: 111 mmol/L (ref 98–111)
Creatinine, Ser: 0.7 mg/dL (ref 0.61–1.24)
GFR, Estimated: >60 mL/min (ref >60)
Glucose, Bld: 96 mg/dL (ref 70–99)
Potassium: 3.5 mmol/L (ref 3.5–5.1)
Sodium: 140 mmol/L (ref 135–145)

## 2022-03-17 LAB — VITAMIN B12: Vitamin B-12: 689 pg/mL (ref 180–914)

## 2022-03-17 NOTE — ED Provider Notes (Signed)
[facility]-EMERGENCY DEPT Provider Note   CSN: [id number] Arrival date & time: 03/17/22  1836     History  No chief complaint on file.   [name] [name] is a 46 y.o. male.  HPI Patient here to get blood tested for diabetes and B12.  He was at the wound clinic today where his left leg wound was evaluated treated and bandaged.  He has a follow-up appointment on 03/23/2022 for ongoing management.  No history of diabetes.  He complains of mild pain in the left leg and wonders if he can have a note to see out of work for a few days.  He works a job Education officer, environmental.  Recommendation from wound doctor is no prolonged standing and elevation periodically throughout the day.    Home Medications Prior to Admission medications   Medication Sig Start Date End Date Taking? Authorizing Provider  doxycycline (VIBRAMYCIN) 100 MG capsule Take 1 capsule (100 mg total) by mouth 2 (two) times daily. 02/18/22   Gerhard Munch, MD  ibuprofen (ADVIL) 600 MG tablet Take 1 tablet (600 mg total) by mouth every 6 (six) hours as needed. 05/06/21   Rhys Martini, PA-C  lisinopril (ZESTRIL) 10 MG tablet Take by mouth. 01/16/20   [provider]      Allergies    Patient has no known allergies.    Review of Systems   Review of Systems  Physical Exam Updated Vital Signs BP 138/79 (BP Location: Left Arm)   Pulse 84   Temp 98.5 F (36.9 C)   Resp 16   SpO2 97%  Physical Exam Vitals and nursing note reviewed.  Constitutional:      General: He is not in acute distress.    Appearance: He is well-developed. He is not ill-appearing.  HENT:     Head: Normocephalic and atraumatic.     Right Ear: External ear normal.     Left Ear: External ear normal.  Eyes:     Conjunctiva/sclera: Conjunctivae normal.     Pupils: Pupils are equal, round, and reactive to light.  Neck:     Trachea: Phonation normal.  Cardiovascular:     Rate and Rhythm: Normal rate.  Pulmonary:     Effort:  Pulmonary effort is normal.  Abdominal:     General: There is no distension.     Palpations: Abdomen is soft.  Musculoskeletal:        General: Normal range of motion.     Cervical back: Normal range of motion and neck supple.     Comments: Wound bandaged, left lower leg.  Skin:    General: Skin is warm and dry.  Neurological:     Mental Status: He is alert and oriented to person, place, and time.     Cranial Nerves: No cranial nerve deficit.     Sensory: No sensory deficit.     Motor: No abnormal muscle tone.     Coordination: Coordination normal.  Psychiatric:        Mood and Affect: Mood normal.        Behavior: Behavior normal.        Thought Content: Thought content normal.        Judgment: Judgment normal.    ED Results / Procedures / Treatments   Labs (all labs ordered are listed, but only abnormal results are displayed) Labs Reviewed  CBC WITH DIFFERENTIAL/PLATELET - Abnormal; Notable for the following components:      Result Value  MCH 34.4 (*)    Platelets 45 (*)    All other components within normal limits  BASIC METABOLIC PANEL - Abnormal; Notable for the following components:   Calcium 8.5 (*)    All other components within normal limits  VITAMIN B12    EKG None  Radiology No results found.  Procedures Procedures    Medications Ordered in ED Medications - No data to display  ED Course/ Medical Decision Making/ A&P                           Medical Decision Making Patient sent for labs.  Screening labs are reassuring.  Vitamin B12 is pending.  He is requesting a work note.  He has to do periodic leg elevation this week so we will take him out of work for 1 week.  Problems Addressed: Wound of left lower extremity, subsequent encounter: chronic illness or injury that poses a threat to life or bodily functions  Amount and/or Complexity of Data Reviewed Independent Historian:     Details: He is a cogent historian Labs: ordered.    Details: CBC,  metabolic panel, vitamin B12-CBC and metabolic panel normal.  Vitamin B12 pending at disposition.  Risk OTC drugs. Decision regarding hospitalization. Risk Details: Patient with left leg wound, treated at the wound clinic today.  He is due to have the bandages changed next week.  He does not require interim treatment for the leg wound.  He requires periodic elevation.  He is given a release from work for 1 week.  Vitamin B12 pending at discharge.  No sign of diabetes that would complicate wound.  Discharged in stable condition.  He does not require hospitalization.           Final Clinical Impression(s) / ED Diagnoses Final diagnoses:  Wound of left lower extremity, subsequent encounter    Rx / DC Orders ED Discharge Orders     None         Mancel Bale, MD 03/17/22 2035

## 2022-03-17 NOTE — ED Notes (Signed)
ED Provider at bedside. 

## 2022-03-17 NOTE — Discharge Instructions (Signed)
Follow-up at the wound clinic on 03/23/2022, at 12:45 PM as scheduled

## 2022-03-17 NOTE — ED Triage Notes (Signed)
Spanish interpreter used for triage. Pt states that he was sent from wound care facility for lab work. Pt has wound to his L leg currently bandaged. Pt denies fever.

## 2022-03-17 NOTE — ED Provider Triage Note (Signed)
Emergency Medicine Provider Triage Evaluation Note  JAYLYNN HOLME , a 46 y.o. male  was evaluated in triage.  Pt complains of to left lower extremity.  Was seen 24 hours ago for similar.  Was seen by wound clinic today.  They told him he needed to have labs done to assess for diabetes.  Denies any worsening of wound.  States has been there since September.  No fever  Review of Systems  Positive: Wound to left lower extremity Negative:   Physical Exam  BP 138/79 (BP Location: Left Arm)   Pulse 84   Temp 98.5 F (36.9 C)   Resp 16   SpO2 97%  Gen:   Awake, no distress   Resp:  Normal effort  MSK:   Moves extremities without difficulty, left lower extremity wrapped from wound care Other:    Medical Decision Making  Medically screening exam initiated at 6:55 PM.  Appropriate orders placed.  Fajardo was informed that the remainder of the evaluation will be completed by another provider, this initial triage assessment does not replace that evaluation, and the importance of remaining in the ED until their evaluation is complete.  Labs, wound LLE   Wilgus Deyton A, PA-C 03/17/22 1856

## 2022-03-23 ENCOUNTER — Encounter (HOSPITAL_BASED_OUTPATIENT_CLINIC_OR_DEPARTMENT_OTHER): Payer: Self-pay | Admitting: Internal Medicine

## 2022-03-23 DIAGNOSIS — I87311 Chronic venous hypertension (idiopathic) with ulcer of right lower extremity: Secondary | ICD-10-CM

## 2022-03-23 DIAGNOSIS — L97828 Non-pressure chronic ulcer of other part of left lower leg with other specified severity: Secondary | ICD-10-CM

## 2022-03-23 NOTE — Progress Notes (Signed)
[name] [name], [name] [name] ([id number]) Visit Report for 03/23/2022 Chief Complaint Document Details Patient Name: Date of Service: [name] [name] [place] A. 03/23/2022 12:45 PM Medical Record Number: [id number] Patient Account Number: [id number] Date of Birth/Sex: Treating RN: Nov 13, 1975 (46 y.o. [name] Primary Care Provider: PCP, NO Other Clinician: Referring Provider: Treating Provider/Extender: Tilda Franco in Treatment: 0 Information Obtained from: Patient Chief Complaint 03/17/2022; patient is here for review of wounds on the left lateral lower leg Electronic Signature(s) Signed: 03/23/2022 4:24:52 PM By: Geralyn Corwin DO Entered By: Geralyn Corwin on 03/23/2022 13:45:22 -------------------------------------------------------------------------------- Debridement Details Patient Name: Date of Service: [name] [name] [place] A. 03/23/2022 12:45 PM Medical Record Number: [id number] Patient Account Number: [id number] Date of Birth/Sex: Treating RN: 07-30-1976 (46 y.o. [name] Primary Care Provider: PCP, NO Other Clinician: Referring Provider: Treating Provider/Extender: Tilda Franco in Treatment: 0 Debridement Performed for Assessment: Wound #1 Left,Proximal,Lateral Lower Leg Performed By: Physician Geralyn Corwin, DO Debridement Type: Debridement Level of Consciousness (Pre-procedure): Awake and Alert Pre-procedure Verification/Time Out Yes - 13:25 Taken: Start Time: 13:26 Pain Control: Lidocaine 5% topical ointment T Area Debrided (L x W): otal 0.7 (cm) x 1 (cm) = 0.7 (cm) Tissue and other material debrided: Viable, Non-Viable, Slough, Subcutaneous, Skin: Dermis , Skin: Epidermis, Fibrin/Exudate, Slough Level: Skin/Subcutaneous Tissue Debridement Description: Excisional Instrument: Curette Bleeding: Minimum Hemostasis Achieved: Pressure End Time: 13:35 Procedural Pain: 0 Post Procedural Pain: 0 Response to Treatment: Procedure was tolerated well Level of  Consciousness (Post- Awake and Alert procedure): Post Debridement Measurements of Total Wound Length: (cm) 0.7 Width: (cm) 1 Depth: (cm) 0.2 Volume: (cm) 0.11 Character of Wound/Ulcer Post Debridement: Improved Post Procedure Diagnosis Same as Pre-procedure Electronic Signature(s) Signed: 03/23/2022 4:24:52 PM By: Geralyn Corwin DO Signed: 03/23/2022 4:29:25 PM By: Shawn Stall RN, BSN Entered By: Shawn Stall on 03/23/2022 13:43:01 -------------------------------------------------------------------------------- Debridement Details Patient Name: Date of Service: [name] [name] [place] A. 03/23/2022 12:45 PM Medical Record Number: [id number] Patient Account Number: [id number] Date of Birth/Sex: Treating RN: 07-06-1976 (46 y.o. [name] Primary Care Provider: PCP, NO Other Clinician: Referring Provider: Treating Provider/Extender: Tilda Franco in Treatment: 0 Debridement Performed for Assessment: Wound #2 Left,Distal,Lateral Lower Leg Performed By: Physician Geralyn Corwin, DO Debridement Type: Debridement Severity of Tissue Pre Debridement: Fat layer exposed Level of Consciousness (Pre-procedure): Awake and Alert Pre-procedure Verification/Time Out Yes - 13:25 Taken: Start Time: 13:26 Pain Control: Lidocaine 5% topical ointment T Area Debrided (L x W): otal 2 (cm) x 2.5 (cm) = 5 (cm) Tissue and other material debrided: Viable, Non-Viable, Slough, Subcutaneous, Skin: Dermis , Skin: Epidermis, Fibrin/Exudate, Slough Level: Skin/Subcutaneous Tissue Debridement Description: Excisional Instrument: Curette Bleeding: Minimum Hemostasis Achieved: Pressure End Time: 13:35 Procedural Pain: 0 Post Procedural Pain: 0 Response to Treatment: Procedure was tolerated well Level of Consciousness (Post- Awake and Alert procedure): Post Debridement Measurements of Total Wound Length: (cm) 2 Width: (cm) 2.5 Depth: (cm) 0.6 Volume: (cm) 2.356 Character of Wound/Ulcer  Post Debridement: Improved Severity of Tissue Post Debridement: Fat layer exposed Post Procedure Diagnosis Same as Pre-procedure Electronic Signature(s) Signed: 03/23/2022 4:24:52 PM By: Geralyn Corwin DO Signed: 03/23/2022 4:29:25 PM By: Shawn Stall RN, BSN Entered By: Shawn Stall on 03/23/2022 13:43:19 -------------------------------------------------------------------------------- HPI Details Patient Name: Date of Service: [name] [name] [place] A. 03/23/2022 12:45 PM Medical Record Number: [id number] Patient Account Number: [id number] Date of Birth/Sex: Treating RN: 1976/02/26 (46 y.o. [name] Primary Care Provider: PCP, NO Other Clinician: Referring Provider: Treating Provider/Extender: Tilda Franco in Treatment: 0  History of Present Illness HPI Description: ADMISSION 03/17/2022 This is a 46 year old man who works at [employer]. For the last 2 months he has had wounds on the left lateral lower leg that are painful making it difficult for him to sleep. He has been seen in urgent care 2 times where the area on the left lateral leg is often described as a rash and in March as a scaly rash that look like psoriasis to the doctor that was seeing him. He was seen in the ER on 510 given a course of prednisone and doxycycline which she has just finished. Again was seen in the ER on 6/4 and was referred here and also to dermatology. He has not heard anything about a dermatology consult. He does not describe a more systemic rash or wounds or a history of wounds in any other site than the left lateral leg. He has 2 wounds on the left lateral leg he has been treating this with gentian violet Past medical history includes hypertension, rashes on the left lower leg that seem to go back to 2021. During the stay in the ER on 5/10 he had lab work that included a reasonably normal comprehensive metabolic panel except a glucose of 162. T bilirubin of 1.8 slightly elevated. More  problematically he had a otal white count of 2.9 hemoglobin was normal at 15.3 but a platelet count of only 43,000. His differential count was reasonably normal. Sed rate of 41. Also had slight macrocytosis His ABI in our clinic was 0.95 03/23/2022; patient presents for follow-up. Dr. Leanord Hawking admitted him last week. He obtained a CBC with differential, BMP and vitamin B12 due to abnormal lab results done in the ED 1 month ago. White blood cell count returned to normal along with the MCV. His vitamin B12 was normal. 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 He also obtained a punch biopsy that showed no specific causative pathologic process identified. It was negative for fungal organisms. He has been using collagen under 3 layer compression. Electronic Signature(s) Signed: 03/23/2022 4:24:52 PM By: Geralyn Corwin DO Entered By: Geralyn Corwin on 03/23/2022 16:20:09 -------------------------------------------------------------------------------- Physical Exam Details Patient Name: Date of Service: [name] [name] [place] A. 03/23/2022 12:45 PM Medical Record Number: [id number] Patient Account Number: [id number] Date of Birth/Sex: Treating RN: 1976/06/19 (46 y.o. [name] Primary Care Provider: PCP, NO Other Clinician: Referring Provider: Treating Provider/Extender: Tilda Franco in Treatment: 0 Constitutional respirations regular, non-labored and within target range for patient.. Cardiovascular 2+ dorsalis pedis/posterior tibialis pulses. Psychiatric pleasant and cooperative. Notes Left lower extremity: 2 open wounds with granulation tissue and nonviable tissue present. Hemosiderin staining throughout. No signs of surrounding infection. Electronic Signature(s) Signed: 03/23/2022 4:24:52 PM By: Geralyn Corwin DO Entered By: Geralyn Corwin on 03/23/2022  16:20:55 -------------------------------------------------------------------------------- Physician Orders Details Patient Name: Date of Service: [name] [name] [place] A. 03/23/2022 12:45 PM Medical Record Number: [id number] Patient Account Number: [id number] Date of Birth/Sex: Treating RN: 10-Oct-1976 (46 y.o. [name] Primary Care Provider: PCP, NO Other Clinician: Referring Provider: Treating Provider/Extender: Tilda Franco in Treatment: 0 Verbal / Phone Orders: No Diagnosis Coding ICD-10 Coding Code Description [phone] Non-pressure chronic ulcer of other part of left lower leg with other specified severity I87.311 Chronic venous hypertension (idiopathic) with ulcer of right lower extremity Follow-up Appointments ppointment in 1 week. - Dr. Mikey Bussing and [place], Room 8 Monday 03/23/2022 215pm Return A Dr. Mikey Bussing and [place], Room 8 Monday 04/06/2022  1245pm Other: - PCR culture taken-once back if positive will order topical antibiotics [facility]. They will call you for purchasing of the medication. ****Once it arrives bring in to the clinic.**** Bathing/ Shower/ Hygiene May shower with protection but do not get wound dressing(s) wet. Edema Control - Lymphedema / SCD / Other Elevate legs to the level of the heart or above for 30 minutes daily and/or when sitting, a frequency of: - 3-4 times a day throughout the day. Avoid standing for long periods of time. Exercise regularly Wound Treatment Wound #1 - Lower Leg Wound Laterality: Left, Lateral, Proximal Cleanser: Soap and Water 1 x Per Week Discharge Instructions: May shower and wash wound with dial antibacterial soap and water prior to dressing change. Cleanser: Wound Cleanser 1 x Per Week Discharge Instructions: Cleanse the wound with wound cleanser prior to applying a clean dressing using gauze sponges, not tissue or cotton balls. Peri-Wound Care: Triamcinolone 15 (g) 1 x Per Week Discharge Instructions: Use  triamcinolone 15 (g) as directed Peri-Wound Care: Sween Lotion (Moisturizing lotion) 1 x Per Week Discharge Instructions: Apply moisturizing lotion as directed Topical: Gentamicin 1 x Per Week Discharge Instructions: As directed by physician Topical: Mupirocin Ointment 1 x Per Week Discharge Instructions: Apply Mupirocin (Bactroban) as instructed Prim Dressing: PolyMem Silver Non-Adhesive Dressing, 4.25x4.25 in 1 x Per Week ary Discharge Instructions: Apply to wound bed as instructed Secondary Dressing: ABD Pad, 8x10 1 x Per Week Discharge Instructions: Apply over primary dressing as directed. Secondary Dressing: Woven Gauze Sponge, Non-Sterile 4x4 in 1 x Per Week Discharge Instructions: Apply over primary dressing as directed. Compression Wrap: ThreePress (3 layer compression wrap) 1 x Per Week Discharge Instructions: Apply three layer compression as directed. Wound #2 - Lower Leg Wound Laterality: Left, Lateral, Distal Cleanser: Soap and Water 1 x Per Week Discharge Instructions: May shower and wash wound with dial antibacterial soap and water prior to dressing change. Cleanser: Wound Cleanser 1 x Per Week Discharge Instructions: Cleanse the wound with wound cleanser prior to applying a clean dressing using gauze sponges, not tissue or cotton balls. Peri-Wound Care: Triamcinolone 15 (g) 1 x Per Week Discharge Instructions: Use triamcinolone 15 (g) as directed Peri-Wound Care: Sween Lotion (Moisturizing lotion) 1 x Per Week Discharge Instructions: Apply moisturizing lotion as directed Topical: Gentamicin 1 x Per Week Discharge Instructions: As directed by physician Topical: Mupirocin Ointment 1 x Per Week Discharge Instructions: Apply Mupirocin (Bactroban) as instructed Prim Dressing: PolyMem Silver Non-Adhesive Dressing, 4.25x4.25 in 1 x Per Week ary Discharge Instructions: Apply to wound bed as instructed Secondary Dressing: ABD Pad, 8x10 1 x Per Week Discharge Instructions: Apply  over primary dressing as directed. Secondary Dressing: Woven Gauze Sponge, Non-Sterile 4x4 in 1 x Per Week Discharge Instructions: Apply over primary dressing as directed. Compression Wrap: ThreePress (3 layer compression wrap) 1 x Per Week Discharge Instructions: Apply three layer compression as directed. Laboratory erobe culture (MICRO) - PCR culture. Bacteria identified in Unspecified specimen by A LOINC Code: 634-6 Convenience Name: Aerobic culture-specimen not specified [facility] hematology - [facility] related to low platelet count. Electronic Signature(s) Signed: 03/23/2022 4:24:52 PM By: Geralyn CorwinHoffman, Jayceion Lisenby DO Entered By: Geralyn CorwinHoffman, Hermela Hardt on 03/23/2022 16:21:03 Prescription 03/23/2022 -------------------------------------------------------------------------------- Frances FurbishJUNCO, Daelyn A. Geralyn CorwinHoffman, Ciara Kagan DO Patient Name: Provider: [birth year] [phone] Date of Birth: NPI#: Judie PetitM [id number] Sex: DEA #: [phone] Phone #: License #[facility] Patient Address: [address] [place], [place] [address]  [address] [place], [place] [address] [phone] Allergies No Known Allergies Provider's Orders hematology - Referral to [facility] Hematology related to low platelet count. Hand Signature: Date(s): Electronic Signature(s) Signed: 03/23/2022 4:24:52 PM By: Geralyn Corwin DO Entered By: Geralyn Corwin on 03/23/2022 16:21:03 -------------------------------------------------------------------------------- Problem List Details Patient Name: Date of Service: [name] [name] [place] A. 03/23/2022 12:45 PM Medical Record Number: [id number] Patient Account Number: [id number] Date of Birth/Sex: Treating RN: Oct 13, 1975 (46 y.o. [name] Primary Care Provider: PCP, NO Other Clinician: Referring Provider: Treating Provider/Extender: Tilda Franco in Treatment: 0 Active  Problems ICD-10 Encounter Code Description Active Date MDM Diagnosis L97.828 Non-pressure chronic ulcer of other part of left lower leg with other specified 03/17/2022 No Yes severity I87.311 Chronic venous hypertension (idiopathic) with ulcer of right lower extremity 03/17/2022 No Yes Inactive Problems Resolved Problems Electronic Signature(s) Signed: 03/23/2022 4:24:52 PM By: Geralyn Corwin DO Entered By: Geralyn Corwin on 03/23/2022 13:45:01 -------------------------------------------------------------------------------- Progress Note Details Patient Name: Date of Service: [name] [name] [place] A. 03/23/2022 12:45 PM Medical Record Number: [id number] Patient Account Number: [id number] Date of Birth/Sex: Treating RN: 11-11-1975 (46 y.o. [name] Primary Care Provider: PCP, NO Other Clinician: Referring Provider: Treating Provider/Extender: Tilda Franco in Treatment: 0 Subjective Chief Complaint Information obtained from Patient 03/17/2022; patient is here for review of wounds on the left lateral lower leg History of Present Illness (HPI) ADMISSION 03/17/2022 This is a 46 year old man who works at [employer]. For the last 2 months he has had wounds on the left lateral lower leg that are painful making it difficult for him to sleep. He has been seen in urgent care 2 times where the area on the left lateral leg is often described as a rash and in March as a scaly rash that look like psoriasis to the doctor that was seeing him. He was seen in the ER on 510 given a course of prednisone and doxycycline which she has just finished. Again was seen in the ER on 6/4 and was referred here and also to dermatology. He has not heard anything about a dermatology consult. He does not describe a more systemic rash or wounds or a history of wounds in any other site than the left lateral leg. He has 2 wounds on the left lateral leg he has been treating this with gentian violet Past  medical history includes hypertension, rashes on the left lower leg that seem to go back to 2021. During the stay in the ER on 5/10 he had lab work that included a reasonably normal comprehensive metabolic panel except a glucose of 162. T bilirubin of 1.8 slightly elevated. More problematically he had a otal white count of 2.9 hemoglobin was normal at 15.3 but a platelet count of only 43,000. His differential count was reasonably normal. Sed rate of 41. Also had slight macrocytosis His ABI in our clinic was 0.95 03/23/2022; patient presents for follow-up. Dr. Leanord Hawking admitted him last week. He obtained a CBC with differential, BMP and vitamin B12 due to abnormal lab results done in the ED 1 month ago. White blood cell count returned to normal along with the MCV. His vitamin B12 was normal. 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 He also obtained a punch biopsy that showed no specific causative pathologic process identified. It was negative for fungal organisms. He has been using collagen under 3 layer compression. Patient History Information obtained from Chart. Family History Diabetes -  Siblings, Heart Disease - Siblings, No family history of Cancer, Hereditary Spherocytosis, Hypertension, Kidney Disease, Lung Disease, Seizures, Stroke, Thyroid Problems, Tuberculosis. Social History Never smoker, Marital Status - Single, Alcohol Use - Never, Drug Use - No History, Caffeine Use - Daily. Medical History Cardiovascular Patient has history of Hypertension Endocrine Patient has history of Type II Diabetes Objective Constitutional respirations regular, non-labored and within target range for patient.. Vitals Time Taken: 12:59 PM, Weight: 205 lbs, Temperature: 98.7 F, Pulse: 83 bpm, Respiratory Rate: 18 breaths/min, Blood Pressure: 135/77 mmHg. Cardiovascular 2+ dorsalis pedis/posterior tibialis pulses. Psychiatric pleasant and cooperative. General  Notes: Left lower extremity: 2 open wounds with granulation tissue and nonviable tissue present. Hemosiderin staining throughout. No signs of surrounding infection. Integumentary (Hair, Skin) Wound #1 status is Open. Original cause of wound was Gradually Appeared. The date acquired was: 01/10/2022. The wound is located on the Left,Proximal,Lateral Lower Leg. The wound measures 0.7cm length x 1cm width x 0.1cm depth; 0.55cm^2 area and 0.055cm^3 volume. There is no tunneling or undermining noted. There is a medium amount of drainage noted. The wound margin is flat and intact. There is no granulation within the wound bed. There is a large (67-100%) amount of necrotic tissue within the wound bed including Eschar. Wound #2 status is Open. Original cause of wound was Gradually Appeared. The date acquired was: 01/10/2022. The wound is located on the Left,Distal,Lateral Lower Leg. The wound measures 2cm length x 2.5cm width x 0.6cm depth; 3.927cm^2 area and 2.356cm^3 volume. There is no tunneling or undermining noted. There is a medium amount of drainage noted. The wound margin is flat and intact. There is no granulation within the wound bed. There is a large (67-100%) amount of necrotic tissue within the wound bed including Eschar. 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 The more distal wound is larger however this is where the punch biopsy was obtained. I debrided nonviable tissue. There does appear to be significant bioburden and I recommended a PCR culture for potential [facility] antibiotics. For now I recommended switching the dressing to gentamicin/mupirocin with PolyMem silver and continue compression therapy. Follow-up in 1 week. Since platelet counts  remain low I recommended a referral to hematology. Procedures Wound #1 Pre-procedure diagnosis of Wound #1 is an Atypical located on the Left,Proximal,Lateral Lower Leg . There was a Excisional Skin/Subcutaneous Tissue Debridement with a total area of 0.7 sq cm performed by Geralyn Corwin, DO. With the following instrument(s): Curette to remove Viable and Non-Viable tissue/material. Material removed includes Subcutaneous Tissue, Slough, Skin: Dermis, Skin: Epidermis, and Fibrin/Exudate after achieving pain control using Lidocaine 5% topical ointment. A time out was conducted at 13:25, prior to the start of the procedure. A Minimum amount of bleeding was controlled with Pressure. The procedure was tolerated well with a pain level of 0 throughout and a pain level of 0 following the procedure. Post Debridement Measurements: 0.7cm length x 1cm width x 0.2cm depth; 0.11cm^3 volume. Character of Wound/Ulcer Post Debridement is improved. Post procedure Diagnosis Wound #1: Same as Pre-Procedure Pre-procedure diagnosis of Wound #1 is an Atypical located on the Left,Proximal,Lateral Lower Leg . There was a Three Layer Compression Therapy Procedure by Shawn Stall, RN. Post procedure Diagnosis Wound #1: Same as Pre-Procedure Wound #2 Pre-procedure diagnosis of Wound #2 is a Venous  Leg Ulcer located on the Left,Distal,Lateral Lower Leg .Severity of Tissue Pre Debridement is: Fat layer exposed. There was a Excisional Skin/Subcutaneous Tissue Debridement with a total area of 5 sq cm performed by Geralyn Corwin, DO. With the following instrument(s): Curette to remove Viable and Non-Viable tissue/material. Material removed includes Subcutaneous Tissue, Slough, Skin: Dermis, Skin: Epidermis, and Fibrin/Exudate after achieving pain control using Lidocaine 5% topical ointment. A time out was conducted at 13:25, prior to the start of the procedure. A Minimum amount of bleeding was controlled with Pressure. The  procedure was tolerated well with a pain level of 0 throughout and a pain level of 0 following the procedure. Post Debridement Measurements: 2cm length x 2.5cm width x 0.6cm depth; 2.356cm^3 volume. Character of Wound/Ulcer Post Debridement is improved. Severity of Tissue Post Debridement is: Fat layer exposed. Post procedure Diagnosis Wound #2: Same as Pre-Procedure Pre-procedure diagnosis of Wound #2 is a Venous Leg Ulcer located on the Left,Distal,Lateral Lower Leg . There was a Three Layer Compression Therapy Procedure by Shawn Stall, RN. Post procedure Diagnosis Wound #2: Same as Pre-Procedure Plan Follow-up Appointments: Return Appointment in 1 week. - Dr. Mikey Bussing and [place], Room 8 Monday 03/23/2022 215pm Dr. Mikey Bussing and [place], Room 8 Monday 04/06/2022 1245pm Other: - PCR culture taken-once back if positive will order topical antibiotics from [facility]. They will call you for purchasing of the medication. ****Once it arrives bring in to the clinic.**** Bathing/ Shower/ Hygiene: May shower with protection but do not get wound dressing(s) wet. Edema Control - Lymphedema / SCD / Other: Elevate legs to the level of the heart or above for 30 minutes daily and/or when sitting, a frequency of: - 3-4 times a day throughout the day. Avoid standing for long periods of time. Exercise regularly Laboratory ordered were: Aerobic culture-specimen not specified - PCR culture. ordered were: hematology - Referral to [facility] related to low platelet count. WOUND #1: - Lower Leg Wound Laterality: Left, Lateral, Proximal Cleanser: Soap and Water 1 x Per Week/ Discharge Instructions: May shower and wash wound with dial antibacterial soap and water prior to dressing change. Cleanser: Wound Cleanser 1 x Per Week/ Discharge Instructions: Cleanse the wound with wound cleanser prior to applying a clean dressing using gauze sponges, not tissue or cotton balls. Peri-Wound Care:  Triamcinolone 15 (g) 1 x Per Week/ Discharge Instructions: Use triamcinolone 15 (g) as directed Peri-Wound Care: Sween Lotion (Moisturizing lotion) 1 x Per Week/ Discharge Instructions: Apply moisturizing lotion as directed Topical: Gentamicin 1 x Per Week/ Discharge Instructions: As directed by physician Topical: Mupirocin Ointment 1 x Per Week/ Discharge Instructions: Apply Mupirocin (Bactroban) as instructed Prim Dressing: PolyMem Silver Non-Adhesive Dressing, 4.25x4.25 in 1 x Per Week/ ary Discharge Instructions: Apply to wound bed as instructed Secondary Dressing: ABD Pad, 8x10 1 x Per Week/ Discharge Instructions: Apply over primary dressing as directed. Secondary Dressing: Woven Gauze Sponge, Non-Sterile 4x4 in 1 x Per Week/ Discharge Instructions: Apply over primary dressing as directed. Com pression Wrap: ThreePress (3 layer compression wrap) 1 x Per Week/ Discharge Instructions: Apply three layer compression as directed. WOUND #2: - Lower Leg Wound Laterality: Left, Lateral, Distal Cleanser: Soap and Water 1 x Per Week/ Discharge Instructions: May shower and wash wound with dial antibacterial soap and water prior to dressing change. Cleanser: Wound Cleanser 1 x Per Week/ Discharge Instructions: Cleanse the wound with wound cleanser prior to applying a clean dressing using gauze sponges, not tissue or cotton balls. Peri-Wound Care: Triamcinolone  15 (g) 1 x Per Week/ Discharge Instructions: Use triamcinolone 15 (g) as directed Peri-Wound Care: Sween Lotion (Moisturizing lotion) 1 x Per Week/ Discharge Instructions: Apply moisturizing lotion as directed Topical: Gentamicin 1 x Per Week/ Discharge Instructions: As directed by physician Topical: Mupirocin Ointment 1 x Per Week/ Discharge Instructions: Apply Mupirocin (Bactroban) as instructed Prim Dressing: PolyMem Silver Non-Adhesive Dressing, 4.25x4.25 in 1 x Per Week/ ary Discharge Instructions: Apply to wound bed as  instructed Secondary Dressing: ABD Pad, 8x10 1 x Per Week/ Discharge Instructions: Apply over primary dressing as directed. Secondary Dressing: Woven Gauze Sponge, Non-Sterile 4x4 in 1 x Per Week/ Discharge Instructions: Apply over primary dressing as directed. Com pression Wrap: ThreePress (3 layer compression wrap) 1 x Per Week/ Discharge Instructions: Apply three layer compression as directed. 1. In office sharp debridement 2. PCR culture 3. Gentamicin/mupirocin ointment with PolyMem silver under 3 layer compression 4. Follow-up in 1 week Electronic Signature(s) Signed: 03/23/2022 4:24:52 PM By: Geralyn Corwin DO Entered By: Geralyn Corwin on 03/23/2022 16:24:04 -------------------------------------------------------------------------------- HxROS Details Patient Name: Date of Service: [name] [name] [place] A. 03/23/2022 12:45 PM Medical Record Number: [id number] Patient Account Number: [id number] Date of Birth/Sex: Treating RN: [birth year] (46 y.o. [name] Primary Care Provider: PCP, NO Other Clinician: Referring Provider: Treating Provider/Extender: Tilda Franco in Treatment: 0 Information Obtained From Chart Cardiovascular Medical History: Positive for: Hypertension Endocrine Medical History: Positive for: Type II Diabetes Immunizations Pneumococcal Vaccine: Received Pneumococcal Vaccination: Yes Received Pneumococcal Vaccination On or After 60th Birthday: No Implantable Devices None Family and Social History Cancer: No; Diabetes: Yes - Siblings; Heart Disease: Yes - Siblings; Hereditary Spherocytosis: No; Hypertension: No; Kidney Disease: No; Lung Disease: No; Seizures: No; Stroke: No; Thyroid Problems: No; Tuberculosis: No; Never smoker; Marital Status - Single; Alcohol Use: Never; Drug Use: No History; Caffeine Use: Daily; Financial Concerns: Yes; Food, Clothing or Shelter Needs: No; Support System Lacking: No; Transportation Concerns: No Electronic  Signature(s) Signed: 03/23/2022 4:24:52 PM By: Geralyn Corwin DO Signed: 03/23/2022 4:29:25 PM By: Shawn Stall RN, BSN Entered By: Geralyn Corwin on 03/23/2022 16:20:13 -------------------------------------------------------------------------------- SuperBill Details Patient Name: Date of Service: [name] [name] [place] A. 03/23/2022 Medical Record Number: [id number] Patient Account Number: [id number] Date of Birth/Sex: Treating RN: February 01, 1976 (46 y.o. 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 Facility Procedures CPT4 Code: 84132440 Description: 11042 - DEB SUBQ TISSUE 20 SQ CM/< ICD-10 Diagnosis Description L97.828 Non-pressure chronic ulcer of other part of left lower leg with other specified Modifier: severity Quantity: 1 Physician Procedures : CPT4 Code Description Modifier [id number] - WC PHYS LEVEL 3 - EST PT ICD-10 Diagnosis Description L97.828 Non-pressure chronic ulcer of other part of left lower leg with other specified severity I87.311 Chronic venous hypertension (idiopathic) with  ulcer of right lower extremity Quantity: 1 : [id number] 11042 - WC PHYS SUBQ TISS 20 SQ CM ICD-10 Diagnosis Description L97.828 Non-pressure chronic ulcer of other part of left lower leg with other specified severity Quantity: 1 Electronic Signature(s) Signed: 03/23/2022 4:24:52 PM By: Geralyn Corwin DO Entered By: Geralyn Corwin on 03/23/2022 16:24:20

## 2022-03-24 NOTE — Progress Notes (Signed)
[name], [name] ([id number]) Visit Report for 03/17/2022 Allergy List Details Patient Name: Date of Service: [name] Carbo [name] 03/17/2022 12:45 PM Medical Record Number: [id number] Patient Account Number: [id number] Date of Birth/Sex: Treating RN: [birth year] (46 y.o. [name] Primary Care Labrina Lines: PCP, NO Other Clinician: Referring Durell Lofaso: Treating Ilithyia Titzer/Extender: [name]: 0 Allergies Active Allergies No Known Allergies Allergy Notes Electronic Signature(s) Signed: 03/24/2022 8:46:10 AM By: [name] Entered By: [name] on 03/17/2022 12:55:25 -------------------------------------------------------------------------------- Arrival Information Details Patient Name: Date of Service: [name] Carbo [name] 03/17/2022 12:45 PM Medical Record Number: [id number] Patient Account Number: [id number] Date of Birth/Sex: Treating RN: March 29, 1976 (46 y.o. [name] Primary Care Kendrix Orman: PCP, NO Other Clinician: Referring Aislynn Cifelli: Treating Dollye Glasser/Extender: [name]: 0 Visit Information Patient Arrived: Ambulatory Arrival Time: 12:52 Accompanied By: self Transfer Assistance: None Patient Identification Verified: Yes Secondary Verification Process Completed: Yes Patient Requires Transmission-Based Precautions: No Patient Has Alerts: Yes Patient Alerts: Translator Required Electronic Signature(s) Signed: 03/24/2022 8:46:10 AM By: [name] Entered By: [name] on 03/17/2022 12:53:17 -------------------------------------------------------------------------------- Clinic Level of Care Assessment Details Patient Name: Date of Service: [name] [name] 03/17/2022 12:45 PM Medical Record Number: [id number] Patient Account Number: [id number] Date of Birth/Sex: Treating RN: 1975-11-29 (46 y.o. [name] Primary Care Audrea Bolte: PCP, NO Other Clinician: Referring Kaysee Hergert: Treating Elizer Bostic/Extender: [name]: 0 Clinic Level of Care Assessment Items TOOL 1 Quantity Score X- 1 0 Use when EandM and Procedure is performed on INITIAL visit ASSESSMENTS - Nursing Assessment / Reassessment X- 1 20 General Physical Exam (combine w/ comprehensive assessment (listed just below) when performed on new pt. evals) X- 1 25 Comprehensive Assessment (HX, ROS, Risk Assessments, Wounds Hx, etc.) ASSESSMENTS - Wound and Skin Assessment / Reassessment X- 1 10 Dermatologic / Skin Assessment (not related to wound area) ASSESSMENTS - Ostomy and/or Continence Assessment and Care []  - 0 Incontinence Assessment and Management []  - 0 Ostomy Care Assessment and Management (repouching, etc.) PROCESS - Coordination of Care []  - 0 Simple Patient / Family Education for ongoing care X- 1 20 Complex (extensive) Patient / Family Education for ongoing care X- 1 10 Staff obtains Programmer, systems, Records, T Results / Process Orders est []  - 0 Staff telephones HHA, Nursing Homes / Clarify orders / etc []  - 0 Routine Transfer to another Facility (non-emergent condition) []  - 0 Routine Hospital Admission (non-emergent condition) X- 1 15 New Admissions / Biomedical engineer / Ordering NPWT Apligraf, etc. , []  - 0 Emergency Hospital Admission (emergent condition) PROCESS - Special Needs []  - 0 Pediatric / Minor Patient Management []  - 0 Isolation Patient Management []  - 0 Hearing / Language / Visual special needs []  - 0 Assessment of Community assistance (transportation, D/C planning, etc.) []  - 0 Additional assistance / Altered mentation []  - 0 Support Surface(s) Assessment (bed, cushion, seat, etc.) INTERVENTIONS - Miscellaneous []  - 0 External ear exam []  - 0 Patient Transfer (multiple staff / Civil Service fast streamer / Similar devices) []  - 0 Simple Staple / Suture removal (25 or less) []  - 0 Complex Staple / Suture removal (26 or more) []  - 0 Hypo/Hyperglycemic Management (do not check if  billed separately) X- 1 15 Ankle / Brachial Index (ABI) - do not check if billed separately Has the patient been seen at the hospital within the last three years: Yes Total Score: 115 Level Of Care: New/Established - Level 3 Electronic Signature(s) Signed: 03/17/2022 6:06:53 PM  By: [name] RN, BSN Entered By: [name] on 03/17/2022 13:48:53 -------------------------------------------------------------------------------- Compression Therapy Details Patient Name: Date of Service: [name] [name] 03/17/2022 12:45 PM Medical Record Number: [id number] Patient Account Number: [id number] Date of Birth/Sex: Treating RN: April 13, 1976 (46 y.o. [name] Primary Care Chanese Hartsough: PCP, NO Other Clinician: Referring Quientin Jent: Treating Rayn Shorb/Extender: [name]: 0 Compression Therapy Performed for Wound Assessment: Wound #1 Left,Proximal,Lateral Lower Leg Performed By: Clinician [name], RN Compression Type: Three Layer Post Procedure Diagnosis Same as Pre-procedure Electronic Signature(s) Signed: 03/17/2022 6:06:53 PM By: [name] RN, BSN Entered By: [name] on 03/17/2022 14:03:05 -------------------------------------------------------------------------------- Compression Therapy Details Patient Name: Date of Service: [name] Carbo [name] 03/17/2022 12:45 PM Medical Record Number: [id number] Patient Account Number: [id number] Date of Birth/Sex: Treating RN: 11-15-1975 (46 y.o. [name] Primary Care Anikka Marsan: PCP, NO Other Clinician: Referring Kolette Vey: Treating Kysa Calais/Extender: [name]: 0 Compression Therapy Performed for Wound Assessment: Wound #2 Left,Distal,Lateral Lower Leg Performed By: Clinician [name], RN Compression Type: Three Layer Post Procedure Diagnosis Same as Pre-procedure Electronic Signature(s) Signed: 03/17/2022 6:06:53 PM By: [name] RN, BSN Entered By: [name] on  03/17/2022 14:03:05 -------------------------------------------------------------------------------- Encounter Discharge Information Details Patient Name: Date of Service: [name] Carbo [name] 03/17/2022 12:45 PM Medical Record Number: [id number] Patient Account Number: [id number] Date of Birth/Sex: Treating RN: 05-11-1976 (46 y.o. [name] Primary Care Marykathleen Russi: PCP, NO Other Clinician: Referring Lev Cervone: Treating Federick Levene/Extender: [name]: 0 Encounter Discharge Information Items Post Procedure Vitals Discharge Condition: Stable Temperature (F): 98.4 Ambulatory Status: Ambulatory Pulse (bpm): 92 Discharge Destination: Home Respiratory Rate (breaths/min): 18 Transportation: Private Auto Blood Pressure (mmHg): 141/87 Accompanied By: interpreter Schedule Follow-up Appointment: Yes Clinical Summary of Care: Electronic Signature(s) Signed: 03/17/2022 6:06:53 PM By: [name] RN, BSN Entered By: [name] on 03/17/2022 14:04:03 -------------------------------------------------------------------------------- Lower Extremity Assessment Details Patient Name: Date of Service: [name] 03/17/2022 12:45 PM Medical Record Number: [id number] Patient Account Number: [id number] Date of Birth/Sex: Treating RN: [birth year] (46 y.o. [name] Primary Care Jalayla Chrismer: PCP, NO Other Clinician: Referring Treshaun Carrico: Treating Dnaiel Voller/Extender: [name]: 0 Edema Assessment Assessed: Shirlyn Goltz: Yes] [Right: No] Edema: [Left: Ye] [Right: s] Calf Left: Right: Point of Measurement: From Medial Instep 39.1 cm Ankle Left: Right: Point of Measurement: From Medial Instep 26.1 cm Vascular Assessment Pulses: Dorsalis Pedis Palpable: [Left:Yes] Doppler Audible: [Left:Yes] Posterior Tibial Palpable: [Left:Yes] Doppler Audible: [Left:Yes] Blood Pressure: Brachial: [Left:130] Ankle: [Left:Dorsalis Pedis: 124 0.95] Electronic  Signature(s) Signed: 03/17/2022 6:06:53 PM By: [name] RN, BSN Signed: 03/24/2022 8:46:10 AM By: [name] Entered By: [name] on 03/17/2022 13:30:16 -------------------------------------------------------------------------------- Multi-Disciplinary Care Plan Details Patient Name: Date of Service: [name] Carbo [name] 03/17/2022 12:45 PM Medical Record Number: [id number] Patient Account Number: [id number] Date of Birth/Sex: Treating RN: 1975-11-10 (45 y.o. [name] Primary Care Amirr Achord: PCP, NO Other Clinician: Referring Delyle Weider: Treating Natayah Warmack/Extender: [name]: 0 Active Inactive Orientation to the Wound Care Program Nursing Diagnoses: Knowledge deficit related to the wound healing center program Goals: Patient/caregiver will verbalize understanding of the [place] Date Initiated: 03/17/2022 Target Resolution Date: 04/17/2022 Goal Status: Active Interventions: Provide education on orientation to the wound center Notes: Pain, Acute or Chronic Nursing Diagnoses: Pain, acute or chronic: actual or potential Potential alteration in comfort, pain Goals: Patient will verbalize adequate pain control and receive pain control interventions during procedures as needed Date Initiated:  03/17/2022 Target Resolution Date: 04/17/2022 Goal Status: Active Patient/caregiver will verbalize comfort level met Date Initiated: 03/17/2022 Target Resolution Date: 04/17/2022 Goal Status: Active Interventions: Encourage patient to take pain medications as prescribed Provide education on pain management Reposition patient for comfort Treatment Activities: Administer pain control measures as ordered : 03/17/2022 Notes: Wound/Skin Impairment Nursing Diagnoses: Knowledge deficit related to ulceration/compromised skin integrity Goals: Patient/caregiver will verbalize understanding of skin care regimen Date Initiated: 03/17/2022 Target  Resolution Date: 04/17/2022 Goal Status: Active Interventions: Assess patient/caregiver ability to perform ulcer/skin care regimen upon admission and as needed Assess ulceration(s) every visit Provide education on ulcer and skin care Treatment Activities: Skin care regimen initiated : 03/17/2022 Topical wound management initiated : 03/17/2022 Notes: Electronic Signature(s) Signed: 03/17/2022 6:06:53 PM By: [name] RN, BSN Entered By: [name] on 03/17/2022 13:46:23 -------------------------------------------------------------------------------- Pain Assessment Details Patient Name: Date of Service: Massie Kluver A. 03/17/2022 12:45 PM Medical Record Number: [id number] Patient Account Number: [id number] Date of Birth/Sex: Treating RN: June 14, 1976 (46 y.o. [name] Primary Care Kalinda Romaniello: PCP, NO Other Clinician: Referring Greta Yung: Treating Harini Dearmond/Extender: [name]: 0 Active Problems Location of Pain Severity and Description of Pain Patient Has Paino Yes Site Locations Pain Location: Pain in Ulcers Rate the pain. Current Pain Level: 8 Pain Management and Medication Current Pain Management: Medication: No Cold Application: No Rest: No Massage: No Activity: No T.E.N.S.: No Heat Application: No Leg drop or elevation: No Is the Current Pain Management Adequate: Adequate How does your wound impact your activities of daily livingo Sleep: No Bathing: No Appetite: No Relationship With Others: No Bladder Continence: No Emotions: No Bowel Continence: No Work: No Toileting: No Drive: No Dressing: No Hobbies: No Electronic Signature(s) Signed: 03/17/2022 6:06:53 PM By: [name] RN, BSN Signed: 03/24/2022 8:46:10 AM By: [name] Entered By: [name] on 03/17/2022 13:30:00 -------------------------------------------------------------------------------- Patient/Caregiver Education Details Patient Name: Date of  Service: [name] [name] 6/6/2023andnbsp12:45 PM Medical Record Number: [id number] Patient Account Number: [id number] Date of Birth/Gender: Treating RN: 09-28-1976 (46 y.o. [name] Primary Care Physician: PCP, NO Other Clinician: Referring Physician: Treating Physician/Extender: [name]: 0 Education Assessment Education Provided To: Patient Education Topics Provided Welcome T [facility]: o Handouts: Welcome T [facility] [place] o Methods: Explain/Verbal Responses: Reinforcements needed Wound/Skin Impairment: Handouts: Caring for Your Ulcer Spanish Methods: Explain/Verbal Responses: Reinforcements needed Electronic Signature(s) Signed: 03/17/2022 6:06:53 PM By: [name] RN, BSN Entered By: [name] on 03/17/2022 13:46:41 -------------------------------------------------------------------------------- Wound Assessment Details Patient Name: Date of Service: Massie Kluver A. 03/17/2022 12:45 PM Medical Record Number: [id number] Patient Account Number: [id number] Date of Birth/Sex: Treating RN: August 14, 1976 (46 y.o. [name] Primary Care Jupiter Kabir: PCP, NO Other Clinician: Referring Draydon Clairmont: Treating Adalind Weitz/Extender: [name]: 0 Wound Status Wound Number: 1 Primary Etiology: Atypical Wound Location: Left, Proximal, Lateral Lower Leg Wound Status: Open Wounding Event: Gradually Appeared Comorbid History: Hypertension, Type II Diabetes Date Acquired: 01/10/2022 Weeks Of Treatment: 0 Clustered Wound: No Photos Wound Measurements Length: (cm) 1.2 Width: (cm) 1.1 Depth: (cm) 0.1 Area: (cm) 1.037 Volume: (cm) 0.104 % Reduction in Area: 0% % Reduction in Volume: 0% Epithelialization: None Tunneling: No Undermining: No Wound Description Classification: Unclassifiable Wound Margin: Flat and Intact Exudate Amount: Medium Wound Bed Granulation Amount: None Present (0%) Necrotic  Amount: Large (67-100%) Necrotic Quality: Eschar Foul Odor After Cleansing: No Slough/Fibrino Yes Exposed Structure Fascia Exposed: No Fat Layer (Subcutaneous Tissue) Exposed:  No Tendon Exposed: No Muscle Exposed: No Joint Exposed: No Bone Exposed: No Electronic Signature(s) Signed: 03/17/2022 6:06:53 PM By: [name] RN, BSN Entered By: [name] on 03/17/2022 13:53:46 -------------------------------------------------------------------------------- Wound Assessment Details Patient Name: Date of Service: Massie Kluver A. 03/17/2022 12:45 PM Medical Record Number: [id number] Patient Account Number: [id number] Date of Birth/Sex: Treating RN: 1976/04/24 (46 y.o. [name] Primary Care Yago Ludvigsen: PCP, NO Other Clinician: Referring Benjamyn Hestand: Treating Lee-Anne Flicker/Extender: [name]: 0 Wound Status Wound Number: 2 Primary Etiology: Venous Leg Ulcer Wound Location: Left, Distal, Lateral Lower Leg Wound Status: Open Wounding Event: Gradually Appeared Comorbid History: Hypertension, Type II Diabetes Date Acquired: 01/10/2022 Weeks Of Treatment: 0 Clustered Wound: No Photos Wound Measurements Length: (cm) 1.5 Width: (cm) 2.4 Depth: (cm) 0.1 Area: (cm) 2.827 Volume: (cm) 0.283 % Reduction in Area: 0% % Reduction in Volume: 0% Epithelialization: None Tunneling: No Undermining: No Wound Description Classification: Unclassifiable Wound Margin: Flat and Intact Exudate Amount: Medium Foul Odor After Cleansing: No Slough/Fibrino No Wound Bed Granulation Amount: None Present (0%) Exposed Structure Necrotic Amount: Large (67-100%) Fascia Exposed: No Necrotic Quality: Eschar Fat Layer (Subcutaneous Tissue) Exposed: No Tendon Exposed: No Muscle Exposed: No Joint Exposed: No Bone Exposed: No Electronic Signature(s) Signed: 03/17/2022 6:06:53 PM By: [name] RN, BSN Entered By: [name] on 03/17/2022  13:49:24 -------------------------------------------------------------------------------- Vitals Details Patient Name: Date of Service: Massie Kluver A. 03/17/2022 12:45 PM Medical Record Number: [id number] Patient Account Number: [id number] Date of Birth/Sex: Treating RN: 12-28-1975 (46 y.o. [name] Primary Care Santiana Glidden: PCP, NO Other Clinician: Referring Hallel Denherder: Treating Jedrek Dinovo/Extender: [name]: 0 Vital Signs Time Taken: 12:55 Temperature (F): 98.4 Weight (lbs): 205 Pulse (bpm): 92 Source: Stated Respiratory Rate (breaths/min): 18 Blood Pressure (mmHg): 141/87 Reference Range: 80 - 120 mg / dl Electronic Signature(s) Signed: 03/24/2022 8:46:10 AM By: [name] Entered By: [name] on 03/17/2022 12:55:12

## 2022-03-24 NOTE — Progress Notes (Signed)
[name] [name], [name] [name] ([id number]) Visit Report for 03/17/2022 Biopsy Details Patient Name: Date of Service: [name] Moras [place] A. 03/17/2022 12:45 PM Medical Record Number: [id number] Patient Account Number: [id number] Date of Birth/Sex: Treating RN: 21-Nov-1975 (46 y.o. [name] Primary Care Provider: PCP, NO Other Clinician: Referring Provider: Treating Provider/Extender: Valentino Saxon in Treatment: 0 Biopsy Performed for: Wound #2 Left, Distal, Lateral Lower Leg Location(s): Wound Margin Performed By: Physician Maxwell Caul., MD Tissue Punch: Yes Size (mm): 5 Number of Specimens T aken: 1 Specimen Sent T Pathology: o Yes Level of Consciousness (Pre-procedure): Awake and Alert Pre-procedure Verification/Time-Out Taken: Yes - 13:55 Pain Control: Lidocaine Injectable Lidocaine Percent: 1% Instrument: Forceps Bleeding: Moderate Hemostasis Achieved: Silver Nitrate Procedural Pain: 0 Post Procedural Pain: 0 Response to Treatment: Procedure was tolerated well Level of Consciousness (Post-procedure): Awake and Alert Post Procedure Diagnosis Same as Pre-procedure Electronic Signature(s) Signed: 03/17/2022 5:45:31 PM By: Baltazar Najjar MD Entered By: Baltazar Najjar on 03/17/2022 14:14:32 -------------------------------------------------------------------------------- Chief Complaint Document Details Patient Name: Date of Service: [name] Moras [place] A. 03/17/2022 12:45 PM Medical Record Number: [id number] Patient Account Number: [id number] Date of Birth/Sex: Treating RN: 25-Dec-1975 (46 y.o. [name] Primary Care Provider: PCP, NO Other Clinician: Referring Provider: Treating Provider/Extender: Valentino Saxon in Treatment: 0 Information Obtained from: Patient Chief Complaint 03/17/2022; patient is here for review of wounds on the left lateral lower leg Electronic Signature(s) Signed: 03/17/2022 5:45:31 PM By: Baltazar Najjar MD Entered By: Baltazar Najjar on 03/17/2022  14:05:29 -------------------------------------------------------------------------------- Debridement Details Patient Name: Date of Service: [name] Moras [place] A. 03/17/2022 12:45 PM Medical Record Number: [id number] Patient Account Number: [id number] Date of Birth/Sex: Treating RN: Aug 21, 1976 (46 y.o. [name] Primary Care Provider: PCP, NO Other Clinician: Referring Provider: Treating Provider/Extender: Valentino Saxon in Treatment: 0 Debridement Performed for Assessment: Wound #1 Left,Proximal,Lateral Lower Leg Performed By: Physician Maxwell Caul., MD Debridement Type: Debridement Level of Consciousness (Pre-procedure): Awake and Alert Pre-procedure Verification/Time Out Yes - 13:45 Taken: Start Time: 13:46 Pain Control: Other : benzocaine 20% T Area Debrided (L x W): otal 1.2 (cm) x 1.1 (cm) = 1.32 (cm) Tissue and other material debrided: Viable, Non-Viable, Eschar, Subcutaneous, Skin: Dermis , Skin: Epidermis, Fibrin/Exudate Level: Skin/Subcutaneous Tissue Debridement Description: Excisional Instrument: Curette Bleeding: Minimum Hemostasis Achieved: Pressure End Time: 13:50 Procedural Pain: 0 Post Procedural Pain: 0 Response to Treatment: Procedure was tolerated well Level of Consciousness (Post- Awake and Alert procedure): Post Debridement Measurements of Total Wound Length: (cm) 1.2 Width: (cm) 1.1 Depth: (cm) 0.1 Volume: (cm) 0.104 Character of Wound/Ulcer Post Debridement: Requires Further Debridement Post Procedure Diagnosis Same as Pre-procedure Electronic Signature(s) Signed: 03/17/2022 5:45:31 PM By: Baltazar Najjar MD Signed: 03/17/2022 6:06:53 PM By: Shawn Stall RN, BSN Entered By: Baltazar Najjar on 03/17/2022 14:14:14 -------------------------------------------------------------------------------- Debridement Details Patient Name: Date of Service: [name] Panning A. 03/17/2022 12:45 PM Medical Record Number: [id number] Patient Account  Number: [id number] Date of Birth/Sex: Treating RN: Jun 03, 1976 (46 y.o. [name] Primary Care Provider: PCP, NO Other Clinician: Referring Provider: Treating Provider/Extender: Valentino Saxon in Treatment: 0 Debridement Performed for Assessment: Wound #2 Left,Distal,Lateral Lower Leg Performed By: Physician Maxwell Caul., MD Debridement Type: Debridement Severity of Tissue Pre Debridement: Fat layer exposed Level of Consciousness (Pre-procedure): Awake and Alert Pre-procedure Verification/Time Out Yes - 13:45 Taken: Start Time: 13:46 Pain Control: Other : benzocaine 20% T Area Debrided (L x W): otal 1.5 (cm) x 2.4 (cm) = 3.6 (cm) Tissue and other material debrided:  Viable, Non-Viable, Eschar, Subcutaneous, Skin: Dermis , Skin: Epidermis, Fibrin/Exudate Level: Skin/Subcutaneous Tissue Debridement Description: Excisional Instrument: Curette Bleeding: Minimum Hemostasis Achieved: Pressure End Time: 13:50 Procedural Pain: 0 Post Procedural Pain: 0 Response to Treatment: Procedure was tolerated well Level of Consciousness (Post- Awake and Alert procedure): Post Debridement Measurements of Total Wound Length: (cm) 1.5 Width: (cm) 2.4 Depth: (cm) 0.1 Volume: (cm) 0.283 Character of Wound/Ulcer Post Debridement: Requires Further Debridement Severity of Tissue Post Debridement: Fat layer exposed Post Procedure Diagnosis Same as Pre-procedure Electronic Signature(s) Signed: 03/17/2022 5:45:31 PM By: Baltazar Najjar MD Signed: 03/17/2022 6:06:53 PM By: Shawn Stall RN, BSN Entered By: Baltazar Najjar on 03/17/2022 14:14:25 -------------------------------------------------------------------------------- HPI Details Patient Name: Date of Service: [name] Panning A. 03/17/2022 12:45 PM Medical Record Number: [id number] Patient Account Number: [id number] Date of Birth/Sex: Treating RN: 1975/11/14 (46 y.o. [name] Primary Care Provider: PCP, NO Other  Clinician: Referring Provider: Treating Provider/Extender: Valentino Saxon in Treatment: 0 History of Present Illness HPI Description: ADMISSION 03/17/2022 This is a 46 year old man who works at [employer]. For the last 2 months he has had wounds on the left lateral lower leg that are painful making it difficult for him to sleep. He has been seen in urgent care 2 times where the area on the left lateral leg is often described as a rash and in March as a scaly rash that look like psoriasis to the doctor that was seeing him. He was seen in the ER on 510 given a course of prednisone and doxycycline which she has just finished. Again was seen in the ER on 6/4 and was referred here and also to dermatology. He has not heard anything about a dermatology consult. He does not describe a more systemic rash or wounds or a history of wounds in any other site than the left lateral leg. He has 2 wounds on the left lateral leg he has been treating this with gentian violet Past medical history includes hypertension, rashes on the left lower leg that seem to go back to 2021. During the stay in the ER on 5/10 he had lab work that included a reasonably normal comprehensive metabolic panel except a glucose of 162. T bilirubin of 1.8 slightly elevated. More problematically he had a otal white count of 2.9 hemoglobin was normal at 15.3 but a platelet count of only 43,000. His differential count was reasonably normal. Sed rate of 41. Also had slight macrocytosis His ABI in our clinic was 0.95 Electronic Signature(s) Signed: 03/17/2022 5:45:31 PM By: Baltazar Najjar MD Entered By: Baltazar Najjar on 03/17/2022 14:13:56 -------------------------------------------------------------------------------- Physical Exam Details Patient Name: Date of Service: [name] Moras [place] A. 03/17/2022 12:45 PM Medical Record Number: [id number] Patient Account Number: [id number] Date of Birth/Sex: Treating RN: Sep 23, 1976 (47 y.o. [name] Primary Care Provider: PCP, NO Other Clinician: Referring Provider: Treating Provider/Extender: Valentino Saxon in Treatment: 0 Constitutional Sitting or standing Blood Pressure is within target range for patient.. Pulse regular and within target range for patient.Marland Kitchen Respirations regular, non-labored and within target range.. Temperature is normal and within the target range for the patient.Marland Kitchen Appears in no distress. Cardiovascular Pedal pulses palpable and strong bilaterally.. . Notes He does not have much in the way of edema no obvious varicosities Electronic Signature(s) Signed: 03/17/2022 5:45:31 PM By: Baltazar Najjar MD Entered By: Baltazar Najjar on 03/17/2022 14:15:49 -------------------------------------------------------------------------------- Physician Orders Details Patient Name: Date of Service: [name] Moras [place] A. 03/17/2022 12:45 PM Medical Record Number: [id number] Patient  Account Number: [id number] Date of Birth/Sex: Treating RN: [birth year] (46 y.o. [name] Primary Care Provider: PCP, NO Other Clinician: Referring Provider: Treating Provider/Extender: Valentino Saxon in Treatment: 0 Verbal / Phone Orders: No Diagnosis Coding Follow-up Appointments ppointment in 1 week. - Dr. Mikey Bussing and [place], Room 8 Monday 03/23/2022 1245 Return A Bathing/ Shower/ Hygiene May shower with protection but do not get wound dressing(s) wet. Edema Control - Lymphedema / SCD / Other Elevate legs to the level of the heart or above for 30 minutes daily and/or when sitting, a frequency of: - 3-4 times a day throughout the day. Avoid standing for long periods of time. Exercise regularly Wound Treatment Wound #1 - Lower Leg Wound Laterality: Left, Lateral, Proximal Cleanser: Soap and Water 1 x Per Week Discharge Instructions: May shower and wash wound with dial antibacterial soap and water prior to dressing change. Cleanser: Wound Cleanser 1 x Per  Week Discharge Instructions: Cleanse the wound with wound cleanser prior to applying a clean dressing using gauze sponges, not tissue or cotton balls. Peri-Wound Care: Triamcinolone 15 (g) 1 x Per Week Discharge Instructions: Use triamcinolone 15 (g) as directed Peri-Wound Care: Sween Lotion (Moisturizing lotion) 1 x Per Week Discharge Instructions: Apply moisturizing lotion as directed Prim Dressing: Promogran Prisma Matrix, 4.34 (sq in) (silver collagen) ary 1 x Per Week Discharge Instructions: Moisten collagen with hydrogel Secondary Dressing: ABD Pad, 8x10 1 x Per Week Discharge Instructions: Apply over primary dressing as directed. Secondary Dressing: Woven Gauze Sponge, Non-Sterile 4x4 in 1 x Per Week Discharge Instructions: Apply over primary dressing as directed. Compression Wrap: ThreePress (3 layer compression wrap) 1 x Per Week Discharge Instructions: Apply three layer compression as directed. Wound #2 - Lower Leg Wound Laterality: Left, Lateral, Distal Cleanser: Soap and Water 1 x Per Week Discharge Instructions: May shower and wash wound with dial antibacterial soap and water prior to dressing change. Cleanser: Wound Cleanser 1 x Per Week Discharge Instructions: Cleanse the wound with wound cleanser prior to applying a clean dressing using gauze sponges, not tissue or cotton balls. Peri-Wound Care: Triamcinolone 15 (g) 1 x Per Week Discharge Instructions: Use triamcinolone 15 (g) as directed Peri-Wound Care: Sween Lotion (Moisturizing lotion) 1 x Per Week Discharge Instructions: Apply moisturizing lotion as directed Prim Dressing: Promogran Prisma Matrix, 4.34 (sq in) (silver collagen) ary 1 x Per Week Discharge Instructions: Moisten collagen with hydrogel Secondary Dressing: ABD Pad, 8x10 1 x Per Week Discharge Instructions: Apply over primary dressing as directed. Secondary Dressing: Woven Gauze Sponge, Non-Sterile 4x4 in 1 x Per Week Discharge Instructions: Apply over  primary dressing as directed. Compression Wrap: ThreePress (3 layer compression wrap) 1 x Per Week Discharge Instructions: Apply three layer compression as directed. Laboratory Bacteria identified in Tissue by Biopsy culture (MICRO) - someone will call you with the biopsy results. LOINC Code: [id number] Convenience Name: Biopsy specimen culture CBC W A Differential panel in Blood (HEM-CBC) - lab work uto LOINC Code: [id number] Convenience Name: CBC W Auto Differential Copywriter, advertising vitamin B12 level - lab work Patient Medications llergies: No Known Allergies A Notifications Medication Indication Start End benzocaine DOSE topical 20 % aerosol - aerosol topical applied only in clinic. Electronic Signature(s) Signed: 03/17/2022 5:45:31 PM By: Baltazar Najjar MD Signed: 03/17/2022 6:06:53 PM By: Shawn Stall RN, BSN Entered By: Shawn Stall on 03/17/2022 14:17:07 Prescription 03/17/2022 -------------------------------------------------------------------------------- Evlyn Courier MD Patient Name: Provider: [birth year] [id number] Date of Birth: NPI#: [id number] Sex: DEA #: [phone]  [id number] Phone #: License #: [facility] Patient Address: [address] [place], [place] [address] [address] [place], [place] [address] [phone] Allergies No Known Allergies Provider's Orders CBC W A Differential panel in Blood - lab work uto LOINC Code: [id number] Convenience Name: CBC W Auto Differential panel Hand Signature: Date(s): Prescription 03/17/2022 Evlyn Courier MD Patient Name: Provider: [birth year] [id number] Date of Birth: NPI#: Judie Petit [id number] Sex: DEA #: [phone] Phone #: License #: Eligha Bridegroom [facility] Patient Address: [address] [place], [place] [address] [address] [place], [place]  [address] [phone] Allergies No Known Allergies Provider's Orders vitamin B12 level - lab work Hand Signature: Date(s): Prescription 03/17/2022 Evlyn Courier MD Patient Name: Provider: May 14, 1976 [id number] Date of Birth: NPI#: Judie Petit [id number] Sex: DEA #: [phone] Phone #: License #: Eligha Bridegroom [facility] Patient Address: [address] [place], [place] [address] [address] [place], [place] [address] [phone] Allergies No Known Allergies Medication Medication: Route: Strength: Form: benzocaine 20 % topical aerosol topical 20 % aerosol Class: TOPICAL LOCAL ANESTHETICS Dose: Frequency / Time: Indication: aerosol topical applied only in clinic. Number of Refills: Number of Units: 0 Generic Substitution: Start Date: End Date: One Time Use: Substitution Permitted No Note to Pharmacy: Hand Signature: Date(s): Electronic Signature(s) Signed: 03/17/2022 5:45:31 PM By: Baltazar Najjar MD Signed: 03/17/2022 6:06:53 PM By: Shawn Stall RN, BSN Entered By: Shawn Stall on 03/17/2022 14:17:07 -------------------------------------------------------------------------------- Problem List Details Patient Name: Date of Service: [name] Moras [place] A. 03/17/2022 12:45 PM Medical Record Number: [id number] Patient Account Number: [id number] Date of Birth/Sex: Treating RN: [birth year] (46 y.o. 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 03/17/2022 12:45 PM Medical Record Number: [id number] Patient Account Number: [id number] Date of Birth/Sex: Treating RN: 01-04-1976 (46 y.o. [name] Primary Care Provider: PCP, NO Other Clinician: Referring Provider: Treating Provider/Extender: Valentino Saxon in Treatment: 0 Subjective Chief Complaint Information obtained from Patient 03/17/2022; patient is here for review of wounds on the left lateral lower leg History of Present Illness (HPI) ADMISSION 03/17/2022 This is a 46 year old man who works at [employer]. For the last 2 months he has had wounds on the left lateral lower leg that are painful making it difficult for him to sleep. He has been seen in urgent care 2 times where the area on the left lateral leg is often described as a rash and in March as a scaly rash that look like psoriasis to the doctor that was seeing him. He was seen in the ER on 510 given a course of prednisone and doxycycline which she has just finished. Again was seen in the ER on 6/4 and was referred here and also to dermatology. He has not heard anything about a dermatology consult. He does not describe a more systemic rash or wounds or  a history of wounds in any other site than the left lateral leg. He has 2 wounds on the left lateral leg he has been treating this with gentian violet Past medical history includes hypertension, rashes on the left lower leg that seem to go back to 2021. During the stay in the ER on 5/10 he had lab work that included a reasonably normal comprehensive metabolic panel except a glucose of 162. T bilirubin of 1.8 slightly elevated. More problematically he had a otal white count of 2.9 hemoglobin was normal at 15.3 but a platelet count  of only 43,000. His differential count was reasonably normal. Sed rate of 41. Also had slight macrocytosis His ABI in our clinic was 0.95 Patient History Information obtained from Chart. Allergies No Known Allergies Family History Diabetes - Siblings, Heart Disease - Siblings, No family history of Cancer, Hereditary Spherocytosis, Hypertension, Kidney Disease, Lung Disease, Seizures, Stroke, Thyroid Problems, Tuberculosis. Social History Never smoker, Marital Status - Single, Alcohol Use - Never, Drug Use - No History, Caffeine Use - Daily. Medical History Cardiovascular Patient has history of Hypertension Endocrine Patient has history of Type II Diabetes Review of Systems (ROS) Eyes Denies complaints or symptoms of Dry Eyes, Vision Changes, Glasses / Contacts. Ear/Nose/Mouth/Throat Denies complaints or symptoms of Chronic sinus problems or rhinitis. Respiratory Denies complaints or symptoms of Chronic or frequent coughs, Shortness of Breath. Gastrointestinal Denies complaints or symptoms of Frequent diarrhea, Nausea, Vomiting. Genitourinary Denies complaints or symptoms of Frequent urination. Integumentary (Skin) Complains or has symptoms of Wounds - bilateral lower leg wounds.. Musculoskeletal Denies complaints or symptoms of Muscle Pain, Muscle Weakness. Neurologic Denies complaints or symptoms of Numbness/parasthesias. Psychiatric Denies complaints or symptoms of Claustrophobia, Suicidal. Objective Constitutional Sitting or standing Blood Pressure is within target range for patient.. Pulse regular and within target range for patient.Marland Kitchen. Respirations regular, non-labored and within target range.. Temperature is normal and within the target range for the patient.Marland Kitchen. Appears in no distress. Vitals Time Taken: 12:55 PM, Weight: 205 lbs, Source: Stated, Temperature: 98.4 F, Pulse: 92 bpm, Respiratory Rate: 18 breaths/min, Blood Pressure: 141/87 mmHg. Cardiovascular Pedal  pulses palpable and strong bilaterally.. General Notes: He does not have much in the way of edema no obvious varicosities Integumentary (Hair, Skin) Wound #1 status is Open. Original cause of wound was Gradually Appeared. The date acquired was: 01/10/2022. The wound is located on the Left,Proximal,Lateral Lower Leg. The wound measures 1.2cm length x 1.1cm width x 0.1cm depth; 1.037cm^2 area and 0.104cm^3 volume. There is no tunneling or undermining noted. There is a medium amount of drainage noted. The wound margin is flat and intact. There is no granulation within the wound bed. There is a large (67-100%) amount of necrotic tissue within the wound bed including Eschar. Wound #2 status is Open. Original cause of wound was Gradually Appeared. The date acquired was: 01/10/2022. The wound is located on the Left,Distal,Lateral Lower Leg. The wound measures 1.5cm length x 2.4cm width x 0.1cm depth; 2.827cm^2 area and 0.283cm^3 volume. There is no tunneling or undermining noted. There is a medium amount of drainage noted. The wound margin is flat and intact. There is no granulation within the wound bed. There is a large (67- 100%) amount of necrotic tissue within the wound bed including Eschar. 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 There was a Excisional Skin/Subcutaneous Tissue Debridement with a total area of 1.32 sq cm performed by Maxwell Caul., MD. With the following instrument(s): Curette to remove Viable and Non-Viable tissue/material. Material removed includes Eschar, Subcutaneous Tissue, Skin: Dermis, Skin: Epidermis, and Fibrin/Exudate after achieving pain control using Other (benzocaine 20%). A time out was  conducted at 13:45, prior to the start of the procedure. A Minimum amount of bleeding was controlled with Pressure. The procedure was tolerated well with a pain level of 0 throughout and a pain level of 0 following the procedure. Post Debridement Measurements: 1.2cm length x 1.1cm width x 0.1cm depth; 0.104cm^3 volume. Character of Wound/Ulcer Post Debridement requires further debridement. Post procedure Diagnosis Wound #1: Same as Pre-Procedure Pre-procedure diagnosis of Wound #1 is an Atypical located on the Left,Proximal,Lateral Lower Leg . There was a Three Layer Compression Therapy Procedure by Shawn Stall, RN. Post procedure Diagnosis Wound #1: Same as Pre-Procedure Wound #2 Pre-procedure diagnosis of Wound #2 is a Venous Leg Ulcer located on the Left,Distal,Lateral Lower Leg .Severity of Tissue Pre Debridement is: Fat layer exposed. There was a Excisional Skin/Subcutaneous Tissue Debridement with a total area of 3.6 sq cm performed by Maxwell Caul., MD. With the following instrument(s): Curette to remove Viable and Non-Viable tissue/material. Material removed includes Eschar, Subcutaneous Tissue, Skin: Dermis, Skin: Epidermis, and Fibrin/Exudate after achieving pain control using Other (benzocaine 20%). A time out was conducted at 13:45, prior to the start of the procedure. A Minimum amount of bleeding was controlled with Pressure. The procedure was tolerated well with a pain level of 0 throughout and a pain level of 0 following the procedure. Post Debridement Measurements: 1.5cm length x 2.4cm width x 0.1cm depth; 0.283cm^3 volume. Character of Wound/Ulcer Post Debridement requires further debridement. Severity of Tissue Post Debridement is: Fat layer exposed. Post procedure Diagnosis Wound #2: Same as Pre-Procedure Pre-procedure diagnosis of Wound #2 is a Venous Leg Ulcer located on the Left, Distal, Lateral Lower Leg . There was a biopsy performed by Maxwell Caul., MD. There  was a biopsy performed on Wound Margin. The skin was cleansed and prepped with anti-septic followed by pain control using Lidocaine Injectable: 1%. Utilizing a 5 mm tissue punch, tissue was removed at its base with the following instrument(s): Forceps and sent to pathology. A Moderate amount of bleeding was controlled with Silver Nitrate. A time out was conducted at 13:55, prior to the start of the procedure. The procedure was tolerated well with a pain level of 0 throughout and a pain level of 0 following the procedure. Post procedure Diagnosis Wound #2: Same as Pre-Procedure Pre-procedure diagnosis of Wound #2 is a Venous Leg Ulcer located on the Left,Distal,Lateral Lower Leg . There was a Three Layer Compression Therapy Procedure by Shawn Stall, RN. Post procedure Diagnosis Wound #2: Same as Pre-Procedure Plan Follow-up Appointments: Return Appointment in 1 week. - Dr. Mikey Bussing and [place], Room 8 Monday 03/23/2022 1245 Bathing/ Shower/ Hygiene: May shower with protection but do not get wound dressing(s) wet. Edema Control - Lymphedema / SCD / Other: Elevate legs to the level of the heart or above for 30 minutes daily and/or when sitting, a frequency of: - 3-4 times a day throughout the day. Avoid standing for long periods of time. Exercise regularly Laboratory ordered were: Biopsy specimen culture - someone will call you with the biopsy results. The following medication(s) was prescribed: benzocaine topical 20 % aerosol aerosol topical applied only  in clinic. was prescribed at facility WOUND #1: - Lower Leg Wound Laterality: Left, Lateral, Proximal Cleanser: Soap and Water 1 x Per Week/ Discharge Instructions: May shower and wash wound with dial antibacterial soap and water prior to dressing change. Cleanser: Wound Cleanser 1 x Per Week/ Discharge Instructions: Cleanse the wound with wound cleanser prior to applying a clean dressing using gauze sponges, not tissue or cotton  balls. Peri-Wound Care: Triamcinolone 15 (g) 1 x Per Week/ Discharge Instructions: Use triamcinolone 15 (g) as directed Peri-Wound Care: Sween Lotion (Moisturizing lotion) 1 x Per Week/ Discharge Instructions: Apply moisturizing lotion as directed Prim Dressing: Promogran Prisma Matrix, 4.34 (sq in) (silver collagen) 1 x Per Week/ ary Discharge Instructions: Moisten collagen with hydrogel Secondary Dressing: ABD Pad, 8x10 1 x Per Week/ Discharge Instructions: Apply over primary dressing as directed. Secondary Dressing: Woven Gauze Sponge, Non-Sterile 4x4 in 1 x Per Week/ Discharge Instructions: Apply over primary dressing as directed. Com pression Wrap: ThreePress (3 layer compression wrap) 1 x Per Week/ Discharge Instructions: Apply three layer compression as directed. WOUND #2: - Lower Leg Wound Laterality: Left, Lateral, Distal Cleanser: Soap and Water 1 x Per Week/ Discharge Instructions: May shower and wash wound with dial antibacterial soap and water prior to dressing change. Cleanser: Wound Cleanser 1 x Per Week/ Discharge Instructions: Cleanse the wound with wound cleanser prior to applying a clean dressing using gauze sponges, not tissue or cotton balls. Peri-Wound Care: Triamcinolone 15 (g) 1 x Per Week/ Discharge Instructions: Use triamcinolone 15 (g) as directed Peri-Wound Care: Sween Lotion (Moisturizing lotion) 1 x Per Week/ Discharge Instructions: Apply moisturizing lotion as directed Prim Dressing: Promogran Prisma Matrix, 4.34 (sq in) (silver collagen) 1 x Per Week/ ary Discharge Instructions: Moisten collagen with hydrogel Secondary Dressing: ABD Pad, 8x10 1 x Per Week/ Discharge Instructions: Apply over primary dressing as directed. Secondary Dressing: Woven Gauze Sponge, Non-Sterile 4x4 in 1 x Per Week/ Discharge Instructions: Apply over primary dressing as directed. Com pression Wrap: ThreePress (3 layer compression wrap) 1 x Per Week/ Discharge Instructions: Apply  three layer compression as directed. 1. 2 small wounds on the lateral leg. I debrided both of these but I was uncertain about the exact diagnosis here. There is not overwhelming evidence that all of this is venous. I therefore biopsied the lower wound with a punch. He tolerated this well. This was after debridement. There is no evidence of infection 2. The entire area around the area wounds on the left lateral leg looked unusual to me. It almost look like there was hemosiderin deposition under the lights. Peeling of the surface epithelium. No evidence of infection. 3. I dressed this with Prisma hydrogel and put him in 3 layer compression. Liberal TCA to the entire area.3 layer compression 4. In looking over his lab work from a month ago in the ER. He had leukopenia with a white count of 2.9 a reasonably normal differential but a platelet count of 43,000. It. His hemoglobin was normal but slightly macrocytic. I repeated this and checked a vitamin B12 level. If these abnormalities are sustained I suspect the next test to order would be a peripheral smear to pathology and possibly a hematology consult 5. He may require venous reflux studies but I would like to wait for the biopsy to come back Electronic Signature(s) Signed: 03/17/2022 5:45:31 PM By: Baltazar Najjar MD Entered By: Baltazar Najjar on 03/17/2022 14:18:59 -------------------------------------------------------------------------------- HxROS Details Patient Name: Date of Service: [name] Moras [place] A. 03/17/2022 12:45 PM  Medical Record Number: [id number] Patient Account Number: [id number] Date of Birth/Sex: Treating RN: Jan 07, 1976 (46 y.o. [name] Primary Care Provider: PCP, NO Other Clinician: Referring Provider: Treating Provider/Extender: Valentino Saxon in Treatment: 0 Information Obtained From Chart Eyes Complaints and Symptoms: Negative for: Dry Eyes; Vision Changes; Glasses / Contacts Ear/Nose/Mouth/Throat Complaints  and Symptoms: Negative for: Chronic sinus problems or rhinitis Respiratory Complaints and Symptoms: Negative for: Chronic or frequent coughs; Shortness of Breath Gastrointestinal Complaints and Symptoms: Negative for: Frequent diarrhea; Nausea; Vomiting Genitourinary Complaints and Symptoms: Negative for: Frequent urination Integumentary (Skin) Complaints and Symptoms: Positive for: Wounds - bilateral lower leg wounds. Musculoskeletal Complaints and Symptoms: Negative for: Muscle Pain; Muscle Weakness Neurologic Complaints and Symptoms: Negative for: Numbness/parasthesias Psychiatric Complaints and Symptoms: Negative for: Claustrophobia; Suicidal Hematologic/Lymphatic Cardiovascular Medical History: Positive for: Hypertension Endocrine Medical History: Positive for: Type II Diabetes Immunological Oncologic Immunizations Pneumococcal Vaccine: Received Pneumococcal Vaccination: Yes Received Pneumococcal Vaccination On or After 60th Birthday: No Implantable Devices None Family and Social History Cancer: No; Diabetes: Yes - Siblings; Heart Disease: Yes - Siblings; Hereditary Spherocytosis: No; Hypertension: No; Kidney Disease: No; Lung Disease: No; Seizures: No; Stroke: No; Thyroid Problems: No; Tuberculosis: No; Never smoker; Marital Status - Single; Alcohol Use: Never; Drug Use: No History; Caffeine Use: Daily; Financial Concerns: Yes; Food, Clothing or Shelter Needs: No; Support System Lacking: No; Transportation Concerns: No Psychologist, prison and probation services) Signed: 03/17/2022 5:45:31 PM By: Baltazar Najjar MD Signed: 03/17/2022 6:06:53 PM By: Shawn Stall RN, BSN Signed: 03/24/2022 8:46:10 AM By: Thayer Dallas Entered By: Thayer Dallas on 03/17/2022 13:08:58 -------------------------------------------------------------------------------- SuperBill Details Patient Name: Date of Service: [name] Moras [place] A. 03/17/2022 Medical Record Number: [id number] Patient Account Number:  [id number] Date of Birth/Sex: Treating RN: [birth year] (46 y.o. 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 D69.6 Thrombocytopenia, unspecified Facility Procedures CPT4 Code: [id number] Description: 11042 - DEB SUBQ TISSUE 20 SQ CM/< ICD-10 Diagnosis Description L97.828 Non-pressure chronic ulcer of other part of left lower leg with other specified Modifier: severity Quantity: 1 Physician Procedures : CPT4 Code Description Modifier [id number] 99204 - WC PHYS LEVEL 4 - NEW PT 25 ICD-10 Diagnosis Description L97.828 Non-pressure chronic ulcer of other part of left lower leg with other specified severity I87.311 Chronic venous hypertension (idiopathic)  with ulcer of right lower extremity D69.6 Thrombocytopenia, unspecified Quantity: 1 : [id number] 11042 - WC PHYS SUBQ TISS 20 SQ CM ICD-10 Diagnosis Description L97.828 Non-pressure chronic ulcer of other part of left lower leg with other specified severity Quantity: 1 Electronic Signature(s) Signed: 03/17/2022 5:45:31 PM By: Baltazar Najjar MD Entered By: Baltazar Najjar on 03/17/2022 14:19:44

## 2022-03-24 NOTE — Progress Notes (Signed)
DREYDAN, SOTTO ([id number]) Visit Report for 03/17/2022 Abuse Risk Screen Details Patient Name: Date of Service: [name] [name] [name] 03/17/2022 12:45 PM Medical Record Number: [id number] Patient Account Number: [id number] Date of Birth/Sex: Treating RN: Oct 08, 1976 (46 y.o. [name] [name] Primary Care [name]: PCP, NO Other Clinician: Referring [name]: Treating [name]: [name]: 0 Abuse Risk Screen Items Answer ABUSE RISK SCREEN: Has anyone close to you tried to hurt or harm you recentlyo No Do you feel uncomfortable with anyone in your familyo No Has anyone forced you do things that you didnt want to doo No Electronic Signature(s) Signed: 03/17/2022 6:06:53 PM By: [name] RN, BSN Signed: 03/24/2022 8:46:10 AM By: [name] Entered By: [name] on 03/17/2022 13:09:27 -------------------------------------------------------------------------------- Activities of Daily Living Details Patient Name: Date of Service: [name] [name] [name] 03/17/2022 12:45 PM Medical Record Number: [id number] Patient Account Number: [id number] Date of Birth/Sex: Treating RN: 1976/05/15 (46 y.o. [name] [name] Primary Care [name]: PCP, NO Other Clinician: Referring [name]: Treating [name]/Extender: [name]: 0 Activities of Daily Living Items Answer Activities of Daily Living (Please select one for each item) Drive Automobile Completely Able T Medications ake Completely Able Use T elephone Completely Able Care for Appearance Completely Able Use T oilet Completely Able Bath / Shower Completely Able Dress Self Completely Able Feed Self Completely Able Walk Completely Able Get In / Out Bed Completely Able Housework Completely Able Prepare Meals Completely Able Handle Money Completely Able Shop for Self Completely Able Electronic Signature(s) Signed: 03/17/2022 6:06:53 PM By: [name] RN, BSN Signed: 03/24/2022 8:46:10 AM  By: [name] Entered By: [name] on 03/17/2022 13:10:01 -------------------------------------------------------------------------------- Education Screening Details Patient Name: Date of Service: [name] [name] [name] 03/17/2022 12:45 PM Medical Record Number: [id number] Patient Account Number: [id number] Date of Birth/Sex: Treating RN: 07-18-1976 (46 y.o. 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 Memory Deficit: No Emotional Barrier: No Cultural/Religious Beliefs Affecting Medical Care: No Physical Barrier Impaired Vision: No Impaired Hearing: No Decreased Hand dexterity: No Knowledge/Comprehension Knowledge Level: Low Comprehension Level: Medium Ability to understand written instructions: Low Ability to understand verbal instructions: High Motivation Anxiety Level: Calm Cooperation: Cooperative Education Importance: Acknowledges Need Interest in Health Problems: Asks Questions Perception: Coherent Willingness to Engage in Self-Management High Activities: Readiness to Engage in Self-Management High Activities: Electronic Signature(s) Signed: 03/17/2022 6:06:53 PM By: [name] RN, BSN Signed: 03/24/2022 8:46:10 AM By: [name] Entered By: [name] on 03/17/2022 13:11:14 -------------------------------------------------------------------------------- Fall Risk Assessment Details Patient Name: Date of Service: [name] [name] [name] 03/17/2022 12:45 PM Medical Record Number: [id number] Patient Account Number: [id number] Date of  Birth/Sex: Treating RN: [birth year] (46 y.o. [name] [name] Primary Care Sruthi Maurer: PCP, NO Other Clinician: Referring Ivey Nembhard: Treating Kentavius Dettore/Extender: [name]: 0 Fall Risk Assessment Items Have you had 2 or more falls in the last 12 monthso 0 No Have you had any fall that resulted in injury in the last 12 monthso 0 No FALLS RISK SCREEN History of falling - immediate or within 3 months 0 No Secondary diagnosis (Do you have 2 or more medical diagnoseso) 0 No Ambulatory aid None/bed rest/wheelchair/nurse 0 No Crutches/cane/walker 0 No Furniture 0 No  Intravenous therapy Access/Saline/Heparin Lock 0 No Gait/Transferring Normal/ bed rest/ wheelchair 0 No Weak (short steps with or without shuffle, stooped but able to lift head while walking, may seek 0 No support from furniture) Impaired (short steps with shuffle, may have difficulty arising from chair, head down, impaired 0 No balance) Mental Status Oriented to own ability 0 No Electronic Signature(s) Signed: 03/17/2022 6:06:53 PM By: [name] RN, BSN Signed: 03/24/2022 8:46:10 AM By: [name] Entered By: [name] on 03/17/2022 13:11:30 -------------------------------------------------------------------------------- Foot Assessment Details Patient Name: Date of Service: [name] [name] [name] 03/17/2022 12:45 PM Medical Record Number: [id number] Patient Account Number: [id number] Date of Birth/Sex: Treating RN: 1976/02/28 (46 y.o. [name] [name] Primary Care Patton Rabinovich: PCP, NO Other Clinician: Referring Jaydan Chretien: Treating Georgenia Salim/Extender: [name]: 0 Foot Assessment Items Site Locations + = Sensation present, - = Sensation absent, C = Callus, U = Ulcer R = Redness, W = Warmth, M = Maceration, PU = Pre-ulcerative lesion F = Fissure, S = Swelling, D = Dryness Assessment Right: Left: Other Deformity: No No Prior Foot Ulcer: No No Prior Amputation: No  No Charcot Joint: No No Ambulatory Status: Ambulatory Without Help Gait: Steady Electronic Signature(s) Signed: 03/17/2022 6:06:53 PM By: [name] RN, BSN Signed: 03/24/2022 8:46:10 AM By: [name] Entered By: [name] on 03/17/2022 13:21:32 -------------------------------------------------------------------------------- Nutrition Risk Screening Details Patient Name: Date of Service: [name] [name] [name] 03/17/2022 12:45 PM Medical Record Number: [id number] Patient Account Number: [id number] Date of Birth/Sex: Treating RN: 1976-07-01 (46 y.o. [name] [name] Primary Care Dayna Geurts: PCP, NO Other Clinician: Referring Ladene Allocca: Treating Travarius Lange/Extender: [name]: 0 Height (in): Weight (lbs): 205 Body Mass Index (BMI): Nutrition Risk Screening Items Score Screening NUTRITION RISK SCREEN: I have an illness or condition that made me change the kind and/or amount of food I eat 0 No I eat fewer than two meals per day 0 No I eat few fruits and vegetables, or milk products 2 Yes I have three or more drinks of beer, liquor or wine almost every day 0 No I have tooth or mouth problems that make it hard for me to eat 0 No I don't always have enough money to buy the food I need 0 No I eat alone most of the time 0 No I take three or more different prescribed or over-the-counter drugs a day 0 No Without wanting to, I have lost or gained 10 pounds in the last six months 0 No I am not always physically able to shop, cook and/or feed myself 0 No Nutrition Protocols Good Risk Protocol 0 No interventions needed Moderate Risk Protocol High Risk Proctocol Risk Level: Good Risk Score: 2 Electronic Signature(s) Signed: 03/17/2022 6:06:53 PM By: [name] RN, BSN Signed: 03/24/2022 8:46:10 AM By: [name] Entered By: [name] on 03/17/2022 13:12:26

## 2022-03-24 NOTE — Progress Notes (Signed)
[name], [name] ([id number]) Visit Report for 03/23/2022 Arrival Information Details Patient Name: Date of Service: [name] [name] [name] 03/23/2022 12:45 PM Medical Record Number: [id number] Patient Account Number: [id number] Date of Birth/Sex: Treating RN: 1976/01/10 (46 y.o. [name] [name] Primary Care [name] [name]: PCP, NO Other Clinician: Referring [name] [name]: Treating [name] [name]/Extender: [name] [name] in Treatment: 0 Visit Information History Since Last Visit Added or deleted any medications: No Patient Arrived: Ambulatory Any new allergies or adverse reactions: No Arrival Time: 12:51 Had a fall or experienced change in No Accompanied By: family member activities of daily living that may affect Transfer Assistance: None risk of falls: Patient Identification Verified: Yes Signs or symptoms of abuse/neglect since last visito No Secondary Verification Process Completed: Yes Hospitalized since last visit: No Patient Requires Transmission-Based Precautions: No Implantable device outside of the clinic excluding No Patient Has Alerts: Yes cellular tissue based products placed in the center Patient Alerts: Translator Required since last visit: Has Dressing in Place as Prescribed: Yes Has Compression in Place as Prescribed: Yes Pain Present Now: Yes Electronic Signature(s) Signed: 03/24/2022 8:46:10 AM By: [name] [name] Entered By: [name] [name] on 03/23/2022 12:56:45 -------------------------------------------------------------------------------- Compression Therapy Details Patient Name: Date of Service: [name] [name] [name] 03/23/2022 12:45 PM Medical Record Number: [id number] Patient Account Number: [id number] Date of Birth/Sex: Treating RN: Feb 25, 1976 (46 y.o. [name] [name] Primary Care [name] [name]: PCP, NO Other Clinician: Referring [name] [name]: Treating [name] [name]/Extender: [name] [name] in Treatment: 0 Compression Therapy Performed for Wound Assessment: Wound #1  Left,Proximal,Lateral Lower Leg Performed By: Clinician [name] Pilling, RN Compression Type: Three Layer Post Procedure Diagnosis Same as Pre-procedure Electronic Signature(s) Signed: 03/23/2022 4:29:25 PM By: [name] Pilling RN, BSN Entered By: [name] [name] on 03/23/2022 13:28:59 -------------------------------------------------------------------------------- Compression Therapy Details Patient Name: Date of Service: [name] [name] [name] 03/23/2022 12:45 PM Medical Record Number: [id number] Patient Account Number: [id number] Date of Birth/Sex: Treating RN: 1976/05/31 (46 y.o. [name] [name] Primary Care [name] [name]: PCP, NO Other Clinician: Referring [name] [name]: Treating [name] [name]/Extender: [name] [name] in Treatment: 0 Compression Therapy Performed for Wound Assessment: Wound #2 Left,Distal,Lateral Lower Leg Performed By: Clinician [name] Pilling, RN Compression Type: Three Layer Post Procedure Diagnosis Same as Pre-procedure Electronic Signature(s) Signed: 03/23/2022 4:29:25 PM By: [name] Pilling RN, BSN Entered By: [name] [name] on 03/23/2022 13:28:59 -------------------------------------------------------------------------------- Encounter Discharge Information Details Patient Name: Date of Service: [name] [name] [name] 03/23/2022 12:45 PM Medical Record Number: [id number] Patient Account Number: [id number] Date of Birth/Sex: Treating RN: 02-Aug-1976 (46 y.o. [name] [name] Primary Care Iana Buzan: PCP, NO Other Clinician: Referring [name] [name]: Treating [name] [name]/Extender: [name] [name] in Treatment: 0 Encounter Discharge Information Items Post Procedure Vitals Discharge Condition: Stable Temperature (F): 98.7 Ambulatory Status: Ambulatory Pulse (bpm): 83 Discharge Destination: Home Respiratory Rate (breaths/min): 20 Transportation: Private Auto Blood Pressure (mmHg): 135/77 Accompanied By: family member Schedule Follow-up Appointment: Yes Clinical Summary of  Care: Electronic Signature(s) Signed: 03/23/2022 4:29:25 PM By: [name] Pilling RN, BSN Entered By: [name] [name] on 03/23/2022 13:44:05 -------------------------------------------------------------------------------- Lower Extremity Assessment Details Patient Name: Date of Service: [name] [name] 03/23/2022 12:45 PM Medical Record Number: [id number] Patient Account Number: [id number] Date of Birth/Sex: Treating RN: 12-May-1976 (46 y.o. [name] [name] Primary Care Quan Cybulski: PCP, NO Other Clinician: Referring Bhavya Grand: Treating Judeen Geralds/Extender: [name] [name] in Treatment: 0 Edema Assessment Assessed: [Left: No] [Right: No] Edema: [Left: Ye] [Right: s] Calf Left: Right: Point of Measurement: From Medial Instep 38 cm Ankle Left: Right: Point of Measurement: From Medial Instep 23 cm Vascular Assessment Pulses:  Dorsalis Pedis Palpable: [Left:Yes] Electronic Signature(s) Signed: 03/23/2022 4:29:25 PM By: [name] Pilling RN, BSN Signed: 03/24/2022 8:46:10 AM By: [name] [name] Entered By: [name] [name] on 03/23/2022 12:59:20 -------------------------------------------------------------------------------- Multi Wound Chart Details Patient Name: Date of Service: [name] [name] [name] 03/23/2022 12:45 PM Medical Record Number: [id number] Patient Account Number: [id number] Date of Birth/Sex: Treating RN: 08-26-1976 (46 y.o. [name] [name] Primary Care Kemyra August: PCP, NO Other Clinician: Referring Derin Granquist: Treating Madeline Bebout/Extender: [name] [name] in Treatment: 0 Vital Signs Height(in): Pulse(bpm): 89 Weight(lbs): 205 Blood Pressure(mmHg): 135/77 Body Mass Index(BMI): Temperature(F): 98.7 Respiratory Rate(breaths/min): 18 Photos: [N/A:N/A] Left, Proximal, Lateral Lower Leg Left, Distal, Lateral Lower Leg N/A Wound Location: Gradually Appeared Gradually Appeared N/A Wounding Event: Atypical Venous Leg Ulcer N/A Primary Etiology: Hypertension, Type II  Diabetes Hypertension, Type II Diabetes N/A Comorbid History: 01/10/2022 01/10/2022 N/A Date Acquired: 0 0 N/A Weeks of Treatment: Open Open N/A Wound Status: No No N/A Wound Recurrence: 0.7x1x0.1 2x2.5x0.6 N/A Measurements L x W x D (cm) 0.55 3.927 N/A A (cm) : rea 0.055 2.356 N/A Volume (cm) : 47.00% -38.90% N/A % Reduction in A rea: 47.10% -732.50% N/A % Reduction in Volume: Unclassifiable Unclassifiable N/A Classification: Medium Medium N/A Exudate A mount: Flat and Intact Flat and Intact N/A Wound Margin: None Present (0%) None Present (0%) N/A Granulation Amount: Large (67-100%) Large (67-100%) N/A Necrotic Amount: Eschar Eschar N/A Necrotic Tissue: Fascia: No Fascia: No N/A Exposed Structures: Fat Layer (Subcutaneous Tissue): No Fat Layer (Subcutaneous Tissue): No Tendon: No Tendon: No Muscle: No Muscle: No Joint: No Joint: No Bone: No Bone: No None None N/A Epithelialization: Debridement - Excisional Debridement - Excisional N/A Debridement: Pre-procedure Verification/Time Out 13:25 13:25 N/A Taken: Lidocaine 5% topical ointment Lidocaine 5% topical ointment N/A Pain Control: Subcutaneous, Slough Subcutaneous, Slough N/A Tissue Debrided: Skin/Subcutaneous Tissue Skin/Subcutaneous Tissue N/A Level: 0.7 5 N/A Debridement A (sq cm): rea Curette Curette N/A Instrument: Minimum Minimum N/A Bleeding: Pressure Pressure N/A Hemostasis A chieved: 0 0 N/A Procedural Pain: 0 0 N/A Post Procedural Pain: Procedure was tolerated well Procedure was tolerated well N/A Debridement Treatment Response: 0.7x1x0.2 2x2.5x0.6 N/A Post Debridement Measurements L x W x D (cm) 0.11 2.356 N/A Post Debridement Volume: (cm) Compression Therapy Compression Therapy N/A Procedures Performed: Debridement Debridement Treatment Notes Wound #1 (Lower Leg) Wound Laterality: Left, Lateral, Proximal Cleanser Soap and Water Discharge Instruction: May shower and  wash wound with dial antibacterial soap and water prior to dressing change. Wound Cleanser Discharge Instruction: Cleanse the wound with wound cleanser prior to applying a clean dressing using gauze sponges, not tissue or cotton balls. Peri-Wound Care Triamcinolone 15 (g) Discharge Instruction: Use triamcinolone 15 (g) as directed Sween Lotion (Moisturizing lotion) Discharge Instruction: Apply moisturizing lotion as directed Topical Gentamicin Discharge Instruction: As directed by physician Mupirocin Ointment Discharge Instruction: Apply Mupirocin (Bactroban) as instructed Primary Dressing PolyMem Silver Non-Adhesive Dressing, 4.25x4.25 in Discharge Instruction: Apply to wound bed as instructed Secondary Dressing ABD Pad, 8x10 Discharge Instruction: Apply over primary dressing as directed. Woven Gauze Sponge, Non-Sterile 4x4 in Discharge Instruction: Apply over primary dressing as directed. Secured With Compression Wrap ThreePress (3 layer compression wrap) Discharge Instruction: Apply three layer compression as directed. Compression Stockings Add-Ons Wound #2 (Lower Leg) Wound Laterality: Left, Lateral, Distal Cleanser Soap and Water Discharge Instruction: May shower and wash wound with dial antibacterial soap and water prior to dressing change. Wound Cleanser Discharge Instruction: Cleanse the wound with wound cleanser prior to applying a clean dressing using gauze sponges, not  tissue or cotton balls. Peri-Wound Care Triamcinolone 15 (g) Discharge Instruction: Use triamcinolone 15 (g) as directed Sween Lotion (Moisturizing lotion) Discharge Instruction: Apply moisturizing lotion as directed Topical Gentamicin Discharge Instruction: As directed by physician Mupirocin Ointment Discharge Instruction: Apply Mupirocin (Bactroban) as instructed Primary Dressing PolyMem Silver Non-Adhesive Dressing, 4.25x4.25 in Discharge Instruction: Apply to wound bed as  instructed Secondary Dressing ABD Pad, 8x10 Discharge Instruction: Apply over primary dressing as directed. Woven Gauze Sponge, Non-Sterile 4x4 in Discharge Instruction: Apply over primary dressing as directed. Secured With Compression Wrap ThreePress (3 layer compression wrap) Discharge Instruction: Apply three layer compression as directed. Compression Stockings Add-Ons Electronic Signature(s) Signed: 03/23/2022 4:24:52 PM By: Kalman Shan DO Signed: 03/23/2022 4:29:25 PM By: [name] Pilling RN, BSN Entered By: Kalman Shan on 03/23/2022 13:45:13 -------------------------------------------------------------------------------- Multi-Disciplinary Care Plan Details Patient Name: Date of Service: [name] [name] [name] 03/23/2022 12:45 PM Medical Record Number: [id number] Patient Account Number: [id number] Date of Birth/Sex: Treating RN: [birth year] (46 y.o. [name] [name] Primary Care Johnta Couts: PCP, NO Other Clinician: Referring Marquies Wanat: Treating Kymberly Blomberg/Extender: [name] [name] in Treatment: 0 Active Inactive Pain, Acute or Chronic Nursing Diagnoses: Pain, acute or chronic: actual or potential Potential alteration in comfort, pain Goals: Patient will verbalize adequate pain control and receive pain control interventions during procedures as needed Date Initiated: 03/17/2022 Target Resolution Date: 04/17/2022 Goal Status: Active Patient/caregiver will verbalize comfort level met Date Initiated: 03/17/2022 Target Resolution Date: 04/17/2022 Goal Status: Active Interventions: Encourage patient to take pain medications as prescribed Provide education on pain management Reposition patient for comfort Treatment Activities: Administer pain control measures as ordered : 03/17/2022 Notes: Wound/Skin Impairment Nursing Diagnoses: Knowledge deficit related to ulceration/compromised skin integrity Goals: Patient/caregiver will verbalize understanding of skin care regimen Date  Initiated: 03/17/2022 Target Resolution Date: 04/17/2022 Goal Status: Active Interventions: Assess patient/caregiver ability to perform ulcer/skin care regimen upon admission and as needed Assess ulceration(s) every visit Provide education on ulcer and skin care Treatment Activities: Skin care regimen initiated : 03/17/2022 Topical wound management initiated : 03/17/2022 Notes: Electronic Signature(s) Signed: 03/23/2022 4:29:25 PM By: [name] Pilling RN, BSN Entered By: [name] [name] on 03/23/2022 13:09:08 -------------------------------------------------------------------------------- Pain Assessment Details Patient Name: Date of Service: [name] [name] [name] 03/23/2022 12:45 PM Medical Record Number: [id number] Patient Account Number: [id number] Date of Birth/Sex: Treating RN: 03-30-1976 (46 y.o. [name] [name] Primary Care Yarah Fuente: PCP, NO Other Clinician: Referring Omaree Fuqua: Treating Solveig Fangman/Extender: [name] [name] in Treatment: 0 Active Problems Location of Pain Severity and Description of Pain Patient Has Paino Yes Site Locations Pain Location: Generalized Pain, Pain in Ulcers Rate the pain. Current Pain Level: 7 Pain Management and Medication Current Pain Management: Medication: No Cold Application: No Rest: No Massage: No Activity: No T.E.N.S.: No Heat Application: No Leg drop or elevation: No Is the Current Pain Management Adequate: Adequate How does your wound impact your activities of daily livingo Sleep: No Bathing: No Appetite: No Relationship With Others: No Bladder Continence: No Emotions: No Bowel Continence: No Work: No Toileting: No Drive: No Dressing: No Hobbies: No Electronic Signature(s) Signed: 03/23/2022 4:29:25 PM By: [name] Pilling RN, BSN Signed: 03/24/2022 8:46:10 AM By: [name] [name] Entered By: [name] [name] on 03/23/2022 12:57:12 -------------------------------------------------------------------------------- Patient/Caregiver  Education Details Patient Name: Date of Service: [name] 6/12/2023andnbsp12:45 PM Medical Record Number: [id number] Patient Account Number: [id number] Date of Birth/Gender: Treating RN: 1976-09-10 (46 y.o. [name] [name] Primary Care Physician: PCP, NO Other Clinician: Referring Physician: Treating Physician/Extender: [name] [name] in Treatment: 0  Education Assessment Education Provided To: Patient Education Topics Provided Wound/Skin Impairment: Handouts: Skin Care Do's and Dont's Methods: Explain/Verbal Responses: Reinforcements needed Electronic Signature(s) Signed: 03/23/2022 4:29:25 PM By: [name] Pilling RN, BSN Entered By: [name] [name] on 03/23/2022 13:09:19 -------------------------------------------------------------------------------- Wound Assessment Details Patient Name: Date of Service: [name] [name] [name] 03/23/2022 12:45 PM Medical Record Number: [id number] Patient Account Number: [id number] Date of Birth/Sex: Treating RN: 05-Jun-1976 (46 y.o. [name] [name] Primary Care Edder Bellanca: PCP, NO Other Clinician: Referring Quinnton Bury: Treating Burel Kahre/Extender: [name] [name] in Treatment: 0 Wound Status Wound Number: 1 Primary Etiology: Atypical Wound Location: Left, Proximal, Lateral Lower Leg Wound Status: Open Wounding Event: Gradually Appeared Comorbid History: Hypertension, Type II Diabetes Date Acquired: 01/10/2022 Weeks Of Treatment: 0 Clustered Wound: No Photos Wound Measurements Length: (cm) 0.7 Width: (cm) 1 Depth: (cm) 0.1 Area: (cm) 0.55 Volume: (cm) 0.055 % Reduction in Area: 47% % Reduction in Volume: 47.1% Epithelialization: None Tunneling: No Undermining: No Wound Description Classification: Unclassifiable Wound Margin: Flat and Intact Exudate Amount: Medium Foul Odor After Cleansing: No Slough/Fibrino Yes Wound Bed Granulation Amount: None Present (0%) Exposed Structure Necrotic Amount: Large  (67-100%) Fascia Exposed: No Necrotic Quality: Eschar Fat Layer (Subcutaneous Tissue) Exposed: No Tendon Exposed: No Muscle Exposed: No Joint Exposed: No Bone Exposed: No Treatment Notes Wound #1 (Lower Leg) Wound Laterality: Left, Lateral, Proximal Cleanser Soap and Water Discharge Instruction: May shower and wash wound with dial antibacterial soap and water prior to dressing change. Wound Cleanser Discharge Instruction: Cleanse the wound with wound cleanser prior to applying a clean dressing using gauze sponges, not tissue or cotton balls. Peri-Wound Care Triamcinolone 15 (g) Discharge Instruction: Use triamcinolone 15 (g) as directed Sween Lotion (Moisturizing lotion) Discharge Instruction: Apply moisturizing lotion as directed Topical Gentamicin Discharge Instruction: As directed by physician Mupirocin Ointment Discharge Instruction: Apply Mupirocin (Bactroban) as instructed Primary Dressing PolyMem Silver Non-Adhesive Dressing, 4.25x4.25 in Discharge Instruction: Apply to wound bed as instructed Secondary Dressing ABD Pad, 8x10 Discharge Instruction: Apply over primary dressing as directed. Woven Gauze Sponge, Non-Sterile 4x4 in Discharge Instruction: Apply over primary dressing as directed. Secured With Compression Wrap ThreePress (3 layer compression wrap) Discharge Instruction: Apply three layer compression as directed. Compression Stockings Add-Ons Electronic Signature(s) Signed: 03/23/2022 4:29:25 PM By: [name] Pilling RN, BSN Signed: 03/24/2022 8:46:10 AM By: [name] [name] Entered By: [name] [name] on 03/23/2022 13:07:44 -------------------------------------------------------------------------------- Wound Assessment Details Patient Name: Date of Service: [name] [name] [name] 03/23/2022 12:45 PM Medical Record Number: [id number] Patient Account Number: [id number] Date of Birth/Sex: Treating RN: 1976/02/12 (46 y.o. [name] [name] Primary Care Kameren Pargas: PCP,  NO Other Clinician: Referring Galit Urich: Treating Reymond Maynez/Extender: [name] [name] in Treatment: 0 Wound Status Wound Number: 2 Primary Etiology: Venous Leg Ulcer Wound Location: Left, Distal, Lateral Lower Leg Wound Status: Open Wounding Event: Gradually Appeared Comorbid History: Hypertension, Type II Diabetes Date Acquired: 01/10/2022 Weeks Of Treatment: 0 Clustered Wound: No Photos Wound Measurements Length: (cm) 2 Width: (cm) 2.5 Depth: (cm) 0.6 Area: (cm) 3.927 Volume: (cm) 2.356 % Reduction in Area: -38.9% % Reduction in Volume: -732.5% Epithelialization: None Tunneling: No Undermining: No Wound Description Classification: Unclassifiable Wound Margin: Flat and Intact Exudate Amount: Medium Foul Odor After Cleansing: No Slough/Fibrino No Wound Bed Granulation Amount: None Present (0%) Exposed Structure Necrotic Amount: Large (67-100%) Fascia Exposed: No Necrotic Quality: Eschar Fat Layer (Subcutaneous Tissue) Exposed: No Tendon Exposed: No Muscle Exposed: No Joint Exposed: No Bone Exposed: No Treatment Notes Wound #2 (Lower Leg) Wound Laterality: Left, Lateral, Distal  Cleanser Soap and Water Discharge Instruction: May shower and wash wound with dial antibacterial soap and water prior to dressing change. Wound Cleanser Discharge Instruction: Cleanse the wound with wound cleanser prior to applying a clean dressing using gauze sponges, not tissue or cotton balls. Peri-Wound Care Triamcinolone 15 (g) Discharge Instruction: Use triamcinolone 15 (g) as directed Sween Lotion (Moisturizing lotion) Discharge Instruction: Apply moisturizing lotion as directed Topical Gentamicin Discharge Instruction: As directed by physician Mupirocin Ointment Discharge Instruction: Apply Mupirocin (Bactroban) as instructed Primary Dressing PolyMem Silver Non-Adhesive Dressing, 4.25x4.25 in Discharge Instruction: Apply to wound bed as instructed Secondary  Dressing ABD Pad, 8x10 Discharge Instruction: Apply over primary dressing as directed. Woven Gauze Sponge, Non-Sterile 4x4 in Discharge Instruction: Apply over primary dressing as directed. Secured With Compression Wrap ThreePress (3 layer compression wrap) Discharge Instruction: Apply three layer compression as directed. Compression Stockings Add-Ons Electronic Signature(s) Signed: 03/23/2022 4:29:25 PM By: [name] Pilling RN, BSN Signed: 03/24/2022 8:46:10 AM By: [name] [name] Entered By: [name] [name] on 03/23/2022 13:07:45 -------------------------------------------------------------------------------- Vitals Details Patient Name: Date of Service: [name] [name] [name] 03/23/2022 12:45 PM Medical Record Number: [id number] Patient Account Number: [id number] Date of Birth/Sex: Treating RN: [birth year] (46 y.o. [name] [name] Primary Care Mayo Owczarzak: PCP, NO Other Clinician: Referring Kartier Bennison: Treating Ron Junco/Extender: [name] [name] in Treatment: 0 Vital Signs Time Taken: 12:59 Temperature (F): 98.7 Weight (lbs): 205 Pulse (bpm): 83 Respiratory Rate (breaths/min): 18 Blood Pressure (mmHg): 135/77 Reference Range: 80 - 120 mg / dl Electronic Signature(s) Signed: 03/24/2022 8:46:10 AM By: [name] [name] Entered By: [name] [name] on 03/23/2022 12:59:49

## 2022-04-06 ENCOUNTER — Encounter (HOSPITAL_BASED_OUTPATIENT_CLINIC_OR_DEPARTMENT_OTHER): Payer: Self-pay | Admitting: Internal Medicine

## 2022-04-06 DIAGNOSIS — L97828 Non-pressure chronic ulcer of other part of left lower leg with other specified severity: Secondary | ICD-10-CM

## 2022-04-06 DIAGNOSIS — I87312 Chronic venous hypertension (idiopathic) with ulcer of left lower extremity: Secondary | ICD-10-CM

## 2022-04-13 ENCOUNTER — Ambulatory Visit (HOSPITAL_BASED_OUTPATIENT_CLINIC_OR_DEPARTMENT_OTHER): Payer: Self-pay | Admitting: General Surgery

## 2022-04-20 ENCOUNTER — Encounter (HOSPITAL_BASED_OUTPATIENT_CLINIC_OR_DEPARTMENT_OTHER): Payer: Self-pay | Attending: Internal Medicine | Admitting: Internal Medicine

## 2022-04-20 DIAGNOSIS — I87312 Chronic venous hypertension (idiopathic) with ulcer of left lower extremity: Secondary | ICD-10-CM | POA: Insufficient documentation

## 2022-04-20 DIAGNOSIS — L97828 Non-pressure chronic ulcer of other part of left lower leg with other specified severity: Secondary | ICD-10-CM | POA: Insufficient documentation

## 2022-04-20 DIAGNOSIS — I10 Essential (primary) hypertension: Secondary | ICD-10-CM | POA: Insufficient documentation

## 2022-04-20 NOTE — Progress Notes (Signed)
[name] [name], [name] [name] ([id number]) Visit Report for 04/20/2022 Chief Complaint Document Details Patient Name: Date of Service: [name] [name] [place] A. 04/20/2022 1:30 PM Medical Record Number: [id number] Patient Account Number: [id number] Date of Birth/Sex: Treating RN: 10/30/1975 (46 y.o. [name] [name] Primary Care Provider: PCP, NO Other Clinician: Referring Provider: Treating Provider/Extender: [name] [name] in Treatment: 4 Information Obtained from: Patient Chief Complaint 03/17/2022; patient is here for review of wounds on the left lateral lower leg Electronic Signature(s) Signed: 04/20/2022 2:02:17 PM By: [name] Corwin DO Entered By: [name] [name] on 04/20/2022 13:58:28 -------------------------------------------------------------------------------- HPI Details Patient Name: Date of Service: [name] [name] [place] A. 04/20/2022 1:30 PM Medical Record Number: [id number] Patient Account Number: [id number] Date of Birth/Sex: Treating RN: 06/20/1976 (46 y.o. [name] [name] Primary Care Provider: PCP, NO Other Clinician: Referring Provider: Treating Provider/Extender: [name] [name] in Treatment: 4 History of Present Illness HPI Description: ADMISSION 03/17/2022 This is a 46 year old man who works at [employer]. For the last 2 months he has had wounds on the left lateral lower leg that are painful making it difficult for him to sleep. He has been seen in urgent care 2 times where the area on the left lateral leg is often described as a rash and in March as a scaly rash that look like psoriasis to the doctor that was seeing him. He was seen in the ER on 510 given a course of prednisone and doxycycline which she has just finished. Again was seen in the ER on 6/4 and was referred here and also to dermatology. He has not heard anything about a dermatology consult. He does not describe a more systemic rash or wounds or a history of wounds in any other site than the left lateral leg. He  has 2 wounds on the left lateral leg he has been treating this with gentian violet Past medical history includes hypertension, rashes on the left lower leg that seem to go back to 2021. During the stay in the ER on 5/10 he had lab work that included a reasonably normal comprehensive metabolic panel except a glucose of 162. T bilirubin of 1.8 slightly elevated. More problematically he had a otal white count of 2.9 hemoglobin was normal at 15.3 but a platelet count of only 43,000. His differential count was reasonably normal. Sed rate of 41. Also had slight macrocytosis His ABI in our clinic was 0.95 03/23/2022; patient presents for follow-up. Dr. Leanord Hawking admitted him last week. He obtained a CBC with differential, BMP and vitamin B12 due to abnormal lab results done in the ED 1 month ago. White blood cell count returned to normal along with the MCV. His vitamin B12 was normal. 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 He also obtained a punch biopsy that showed no specific causative pathologic process identified. It was negative for fungal organisms. He has been using collagen under 3 layer compression. 6/26; patient presents for follow-up. We have been using gentamicin/mupirocin with PolyMem silver under 3 layer compression. We did a PCR culture at last clinic visit that grew Extra high levels of E. coli and medium levels of Staph aureus. We sent for [facility] antibiotics however his insurance is not active currently. He was unable to obtain this. He currently denies signs of infection. 7/10; patient presents for follow-up. We have been using gentamicin and mupirocin under 3 layer compression with PolyMem silver. He has not ordered [facility] antibiotics yet due to insurance issues. He currently  denies signs of infection. Electronic Signature(s) Signed: 04/20/2022 2:02:17 PM By: [name] Corwin DO Entered By: [name] [name] on 04/20/2022  13:59:15 -------------------------------------------------------------------------------- Physical Exam Details Patient Name: Date of Service: [name] [name] [place] A. 04/20/2022 1:30 PM Medical Record Number: [id number] Patient Account Number: [id number] Date of Birth/Sex: Treating RN: 12-21-1975 (46 y.o. [name] [name] Primary Care Provider: PCP, NO Other Clinician: Referring Provider: Treating Provider/Extender: [name] [name] in Treatment: 4 Constitutional respirations regular, non-labored and within target range for patient.. Cardiovascular 2+ dorsalis pedis/posterior tibialis pulses. Psychiatric pleasant and cooperative. Notes Left lower extremity: 2 open wounds with granulation tissue present. Hemosiderin staining throughout the leg. 2+ pitting edema to the knee. No signs of surrounding soft tissue infection. The more proximal wound has epithelialized. Electronic Signature(s) Signed: 04/20/2022 2:02:17 PM By: [name] Corwin DO Entered By: [name] [name] on 04/20/2022 13:59:52 -------------------------------------------------------------------------------- Physician Orders Details Patient Name: Date of Service: [name] [name] [place] A. 04/20/2022 1:30 PM Medical Record Number: [id number] Patient Account Number: [id number] Date of Birth/Sex: Treating RN: 1976/07/05 (46 y.o. [name] [name] Primary Care Provider: PCP, NO Other Clinician: Referring Provider: Treating Provider/Extender: [name] [name] in Treatment: 4 Verbal / Phone Orders: No Diagnosis Coding ICD-10 Coding Code Description [phone] Non-pressure chronic ulcer of other part of left lower leg with other specified severity I87.312 Chronic venous hypertension (idiopathic) with ulcer of left lower extremity Follow-up Appointments ppointment in 1 week. - Dr. Mikey Bussing and [place], Room 8 04/27/2022 215pm Return A ppointment in 2 weeks. - Dr. Mikey Bussing and [place], Room 8 05/04/2022 215pm Return A Other: - Patient to  purchase compression stockings. Leg measurements provided. Bathing/ Shower/ Hygiene May shower with protection but do not get wound dressing(s) wet. Edema Control - Lymphedema / SCD / Other Elevate legs to the level of the heart or above for 30 minutes daily and/or when sitting, a frequency of: - 3-4 times a day throughout the day. Avoid standing for long periods of time. Exercise regularly Compression stocking or Garment 20-30 mm/Hg pressure to: - Patient to purchase and bring in weekly to appts and once healed will apply it. Wound Treatment Wound #1 - Lower Leg Wound Laterality: Left, Lateral, Proximal Cleanser: Soap and Water 1 x Per Week/30 Days Discharge Instructions: May shower and wash wound with dial antibacterial soap and water prior to dressing change. Cleanser: Wound Cleanser 1 x Per Week/30 Days Discharge Instructions: Cleanse the wound with wound cleanser prior to applying a clean dressing using gauze sponges, not tissue or cotton balls. Peri-Wound Care: Triamcinolone 15 (g) 1 x Per Week/30 Days Discharge Instructions: Use triamcinolone 15 (g) as directed Peri-Wound Care: Sween Lotion (Moisturizing lotion) 1 x Per Week/30 Days Discharge Instructions: Apply moisturizing lotion as directed Topical: Gentamicin 1 x Per Week/30 Days Discharge Instructions: As directed by physician mix with mupirocin. Topical: Mupirocin Ointment 1 x Per Week/30 Days Discharge Instructions: Apply Mupirocin (Bactroban) as instructed Prim Dressing: PolyMem Silver Non-Adhesive Dressing, 4.25x4.25 in 1 x Per Week/30 Days ary Discharge Instructions: Apply to wound bed as instructed Secondary Dressing: ABD Pad, 8x10 1 x Per Week/30 Days Discharge Instructions: Apply over primary dressing as directed. Compression Wrap: ThreePress (3 layer compression wrap) 1 x Per Week/30 Days Discharge Instructions: Apply three layer compression as directed. Wound #2 - Lower Leg Wound Laterality: Left, Lateral,  Distal Cleanser: Soap and Water 1 x Per Week/30 Days Discharge Instructions: May shower and wash wound with dial antibacterial soap and water prior to dressing change. Cleanser: Wound Cleanser 1 x Per Week/30  Days Discharge Instructions: Cleanse the wound with wound cleanser prior to applying a clean dressing using gauze sponges, not tissue or cotton balls. Peri-Wound Care: Triamcinolone 15 (g) 1 x Per Week/30 Days Discharge Instructions: Use triamcinolone 15 (g) as directed Peri-Wound Care: Sween Lotion (Moisturizing lotion) 1 x Per Week/30 Days Discharge Instructions: Apply moisturizing lotion as directed Topical: Gentamicin 1 x Per Week/30 Days Discharge Instructions: As directed by physician mix with mupirocin. Topical: Mupirocin Ointment 1 x Per Week/30 Days Discharge Instructions: Apply Mupirocin (Bactroban) as instructed Prim Dressing: PolyMem Silver Non-Adhesive Dressing, 4.25x4.25 in 1 x Per Week/30 Days ary Discharge Instructions: Apply to wound bed as instructed Secondary Dressing: ABD Pad, 8x10 1 x Per Week/30 Days Discharge Instructions: Apply over primary dressing as directed. Compression Wrap: ThreePress (3 layer compression wrap) 1 x Per Week/30 Days Discharge Instructions: Apply three layer compression as directed. Wound #3 - Lower Leg Wound Laterality: Left, Anterior Cleanser: Soap and Water 1 x Per Week/30 Days Discharge Instructions: May shower and wash wound with dial antibacterial soap and water prior to dressing change. Cleanser: Wound Cleanser 1 x Per Week/30 Days Discharge Instructions: Cleanse the wound with wound cleanser prior to applying a clean dressing using gauze sponges, not tissue or cotton balls. Peri-Wound Care: Triamcinolone 15 (g) 1 x Per Week/30 Days Discharge Instructions: Use triamcinolone 15 (g) as directed Peri-Wound Care: Sween Lotion (Moisturizing lotion) 1 x Per Week/30 Days Discharge Instructions: Apply moisturizing lotion as  directed Topical: Gentamicin 1 x Per Week/30 Days Discharge Instructions: As directed by physician mix with mupirocin. Topical: Mupirocin Ointment 1 x Per Week/30 Days Discharge Instructions: Apply Mupirocin (Bactroban) as instructed Prim Dressing: PolyMem Silver Non-Adhesive Dressing, 4.25x4.25 in 1 x Per Week/30 Days ary Discharge Instructions: Apply to wound bed as instructed Secondary Dressing: ABD Pad, 8x10 1 x Per Week/30 Days Discharge Instructions: Apply over primary dressing as directed. Compression Wrap: ThreePress (3 layer compression wrap) 1 x Per Week/30 Days Discharge Instructions: Apply three layer compression as directed. Electronic Signature(s) Signed: 04/20/2022 2:02:17 PM By: [name] CorwinHoffman, Meng Winterton DO Entered By: [name] CorwinHoffman, Harles Evetts on 04/20/2022 14:00:01 -------------------------------------------------------------------------------- Problem List Details Patient Name: Date of Service: [name] MorasJUNCO, [name] [place] A. 04/20/2022 1:30 PM Medical Record Number: [id number] Patient Account Number: [id number] Date of Birth/Sex: Treating RN: [birth year] (46 y.o. [name] SoursM) Deaton, [name] [name] Primary Care Provider: PCP, NO Other Clinician: Referring Provider: Treating Provider/Extender: [name] FrancoHoffman, Jessalyn Hinojosa Weeks in Treatment: 4 Active Problems ICD-10 Encounter Code Description Active Date MDM Diagnosis L97.828 Non-pressure chronic ulcer of other part of left lower leg with other specified 03/17/2022 No Yes severity I87.312 Chronic venous hypertension (idiopathic) with ulcer of left lower extremity 04/06/2022 No Yes Inactive Problems Resolved Problems Electronic Signature(s) Signed: 04/20/2022 2:02:17 PM By: [name] CorwinHoffman, Leeann Bady DO Entered By: [name] CorwinHoffman, Zayed Griffie on 04/20/2022 13:58:17 -------------------------------------------------------------------------------- Progress Note Details Patient Name: Date of Service: [name] MorasJUNCO, [name] [place] A. 04/20/2022 1:30 PM Medical Record Number: [id number] Patient Account Number:  [id number] Date of Birth/Sex: Treating RN: [birth year] (46 y.o. [name] SoursM) Deaton, [name] [name] Primary Care Provider: PCP, NO Other Clinician: Referring Provider: Treating Provider/Extender: [name] FrancoHoffman, Sheyenne Konz Weeks in Treatment: 4 Subjective Chief Complaint Information obtained from Patient 03/17/2022; patient is here for review of wounds on the left lateral lower leg History of Present Illness (HPI) ADMISSION 03/17/2022 This is a 46 year old man who works at [employer]. For the last 2 months he has had wounds on the left lateral lower leg that are painful making it difficult for him to sleep. He has been seen in urgent care 2 times  where the area on the left lateral leg is often described as a rash and in March as a scaly rash that look like psoriasis to the doctor that was seeing him. He was seen in the ER on 510 given a course of prednisone and doxycycline which she has just finished. Again was seen in the ER on 6/4 and was referred here and also to dermatology. He has not heard anything about a dermatology consult. He does not describe a more systemic rash or wounds or a history of wounds in any other site than the left lateral leg. He has 2 wounds on the left lateral leg he has been treating this with gentian violet Past medical history includes hypertension, rashes on the left lower leg that seem to go back to 2021. During the stay in the ER on 5/10 he had lab work that included a reasonably normal comprehensive metabolic panel except a glucose of 162. T bilirubin of 1.8 slightly elevated. More problematically he had a otal white count of 2.9 hemoglobin was normal at 15.3 but a platelet count of only 43,000. His differential count was reasonably normal. Sed rate of 41. Also had slight macrocytosis His ABI in our clinic was 0.95 03/23/2022; patient presents for follow-up. Dr. Leanord Hawking admitted him last week. He obtained a CBC with differential, BMP and vitamin B12 due to abnormal lab results done in  the ED 1 month ago. White blood cell count returned to normal along with the MCV. His vitamin B12 was normal. 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 He also obtained a punch biopsy that showed no specific causative pathologic process identified. It was negative for fungal organisms. He has been using collagen under 3 layer compression. 6/26; patient presents for follow-up. We have been using gentamicin/mupirocin with PolyMem silver under 3 layer compression. We did a PCR culture at last clinic visit that grew Extra high levels of E. coli and medium levels of Staph aureus. We sent for [facility] antibiotics however his insurance is not active currently. He was unable to obtain this. He currently denies signs of infection. 7/10; patient presents for follow-up. We have been using gentamicin and mupirocin under 3 layer compression with PolyMem silver. He has not ordered [facility] antibiotics yet due to insurance issues. He currently denies signs of infection. Patient History Information obtained from Chart. Family History Diabetes - Siblings, Heart Disease - Siblings, No family history of Cancer, Hereditary Spherocytosis, Hypertension, Kidney Disease, Lung Disease, Seizures, Stroke, Thyroid Problems, Tuberculosis. Social History Never smoker, Marital Status - Single, Alcohol Use - Never, Drug Use - No History, Caffeine Use - Daily. Medical History Cardiovascular Patient has history of Hypertension Endocrine Patient has history of Type II Diabetes Objective Constitutional respirations regular, non-labored and within target range for patient.. Vitals Time Taken: 1:30 PM, Weight: 205 lbs, Temperature: 98.4 F, Pulse: 105 bpm, Respiratory Rate: 18 breaths/min, Blood Pressure: 154/89 mmHg. Cardiovascular 2+ dorsalis pedis/posterior tibialis pulses. Psychiatric pleasant and cooperative. General Notes: Left lower extremity: 2 open wounds with granulation tissue  present. Hemosiderin staining throughout the leg. 2+ pitting edema to the knee. No signs of surrounding soft tissue infection. The more proximal wound has epithelialized. Integumentary (Hair, Skin) Wound #1 status is Open. Original cause of wound was Gradually Appeared. The date acquired was: 01/10/2022. The wound has been in treatment 4 weeks. The wound is located on the Left,Proximal,Lateral Lower Leg. The wound measures 0.1cm length x 0.1cm width x 0.1cm  depth; 0.008cm^2 area and 0.001cm^3 volume. There is Fat Layer (Subcutaneous Tissue) exposed. There is no tunneling or undermining noted. There is a medium amount of drainage noted. The wound margin is flat and intact. There is medium (34-66%) red, pink granulation within the wound bed. There is a medium (34-66%) amount of necrotic tissue within the wound bed including Adherent Slough. Wound #2 status is Open. Original cause of wound was Gradually Appeared. The date acquired was: 01/10/2022. The wound has been in treatment 4 weeks. The wound is located on the Left,Distal,Lateral Lower Leg. The wound measures 1.1cm length x 0.9cm width x 0.1cm depth; 0.778cm^2 area and 0.078cm^3 volume. There is Fat Layer (Subcutaneous Tissue) exposed. There is no tunneling or undermining noted. There is a medium amount of drainage noted. The wound margin is flat and intact. There is large (67-100%) pink granulation within the wound bed. There is a small (1-33%) amount of necrotic tissue within the wound bed including Adherent Slough. Wound #3 status is Open. Original cause of wound was Gradually Appeared. The date acquired was: 04/06/2022. The wound has been in treatment 2 weeks. The wound is located on the Left,Anterior Lower Leg. The wound measures 0.2cm length x 0.2cm width x 0.1cm depth; 0.031cm^2 area and 0.003cm^3 volume. There is Fat Layer (Subcutaneous Tissue) exposed. There is no tunneling or undermining noted. There is a medium amount of serosanguineous  drainage noted. The wound margin is distinct with the outline attached to the wound base. There is large (67-100%) red granulation within the wound bed. There is no necrotic tissue within the wound bed. 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 No need for debridement today. The more proximal wound has healed. I recommended continuing gentamicin/mupirocin with PolyMem silver under 3 layer compression. We also gave him leg measurements today to order compression stockings. I recommended using these daily once the wounds heal. Procedures Wound #1 Pre-procedure diagnosis of Wound #1 is an Atypical located on the Left,Proximal,Lateral Lower Leg . There was a Three Layer Compression Therapy Procedure by Shawn Stall, RN. Post procedure Diagnosis Wound #1: Same as Pre-Procedure Wound #2 Pre-procedure diagnosis of Wound #2 is a Venous Leg Ulcer located on the Left,Distal,Lateral Lower Leg . There was a Three Layer Compression Therapy Procedure by Shawn Stall, RN. Post procedure Diagnosis Wound #2: Same as Pre-Procedure Wound #3 Pre-procedure diagnosis of Wound #3 is a Venous Leg Ulcer located on the Left,Anterior Lower Leg . There was a Three Layer Compression Therapy Procedure by Shawn Stall, RN. Post procedure Diagnosis Wound #3: Same as Pre-Procedure Plan Follow-up Appointments: Return Appointment in 1 week. - Dr. Mikey Bussing and [place], Room 8 04/27/2022 215pm Return Appointment in 2 weeks. - Dr. Mikey Bussing and [place], Room 8 05/04/2022 215pm Other: - Patient to purchase compression stockings. Leg measurements provided. Bathing/ Shower/ Hygiene: May shower with protection but do not get wound dressing(s) wet. Edema Control - Lymphedema / SCD /  Other: Elevate legs to the level of the heart or above for 30 minutes daily and/or when sitting, a frequency of: - 3-4 times a day throughout the day. Avoid standing for long periods of time. Exercise regularly Compression stocking or Garment 20-30 mm/Hg pressure to: - Patient to purchase and bring in weekly to appts and once healed will apply it. WOUND #1: -  Lower Leg Wound Laterality: Left, Lateral, Proximal Cleanser: Soap and Water 1 x Per Week/30 Days Discharge Instructions: May shower and wash wound with dial antibacterial soap and water prior to dressing change. Cleanser: Wound Cleanser 1 x Per Week/30 Days Discharge Instructions: Cleanse the wound with wound cleanser prior to applying a clean dressing using gauze sponges, not tissue or cotton balls. Peri-Wound Care: Triamcinolone 15 (g) 1 x Per Week/30 Days Discharge Instructions: Use triamcinolone 15 (g) as directed Peri-Wound Care: Sween Lotion (Moisturizing lotion) 1 x Per Week/30 Days Discharge Instructions: Apply moisturizing lotion as directed Topical: Gentamicin 1 x Per Week/30 Days Discharge Instructions: As directed by physician mix with mupirocin. Topical: Mupirocin Ointment 1 x Per Week/30 Days Discharge Instructions: Apply Mupirocin (Bactroban) as instructed Prim Dressing: PolyMem Silver Non-Adhesive Dressing, 4.25x4.25 in 1 x Per Week/30 Days ary Discharge Instructions: Apply to wound bed as instructed Secondary Dressing: ABD Pad, 8x10 1 x Per Week/30 Days Discharge Instructions: Apply over primary dressing as directed. Com pression Wrap: ThreePress (3 layer compression wrap) 1 x Per Week/30 Days Discharge Instructions: Apply three layer compression as directed. WOUND #2: - Lower Leg Wound Laterality: Left, Lateral, Distal Cleanser: Soap and Water 1 x Per Week/30 Days Discharge Instructions: May shower and wash wound with dial antibacterial soap and water prior to dressing change. Cleanser: Wound Cleanser 1 x Per  Week/30 Days Discharge Instructions: Cleanse the wound with wound cleanser prior to applying a clean dressing using gauze sponges, not tissue or cotton balls. Peri-Wound Care: Triamcinolone 15 (g) 1 x Per Week/30 Days Discharge Instructions: Use triamcinolone 15 (g) as directed Peri-Wound Care: Sween Lotion (Moisturizing lotion) 1 x Per Week/30 Days Discharge Instructions: Apply moisturizing lotion as directed Topical: Gentamicin 1 x Per Week/30 Days Discharge Instructions: As directed by physician mix with mupirocin. Topical: Mupirocin Ointment 1 x Per Week/30 Days Discharge Instructions: Apply Mupirocin (Bactroban) as instructed Prim Dressing: PolyMem Silver Non-Adhesive Dressing, 4.25x4.25 in 1 x Per Week/30 Days ary Discharge Instructions: Apply to wound bed as instructed Secondary Dressing: ABD Pad, 8x10 1 x Per Week/30 Days Discharge Instructions: Apply over primary dressing as directed. Com pression Wrap: ThreePress (3 layer compression wrap) 1 x Per Week/30 Days Discharge Instructions: Apply three layer compression as directed. WOUND #3: - Lower Leg Wound Laterality: Left, Anterior Cleanser: Soap and Water 1 x Per Week/30 Days Discharge Instructions: May shower and wash wound with dial antibacterial soap and water prior to dressing change. Cleanser: Wound Cleanser 1 x Per Week/30 Days Discharge Instructions: Cleanse the wound with wound cleanser prior to applying a clean dressing using gauze sponges, not tissue or cotton balls. Peri-Wound Care: Triamcinolone 15 (g) 1 x Per Week/30 Days Discharge Instructions: Use triamcinolone 15 (g) as directed Peri-Wound Care: Sween Lotion (Moisturizing lotion) 1 x Per Week/30 Days Discharge Instructions: Apply moisturizing lotion as directed Topical: Gentamicin 1 x Per Week/30 Days Discharge Instructions: As directed by physician mix with mupirocin. Topical: Mupirocin Ointment 1 x Per Week/30 Days Discharge Instructions: Apply Mupirocin  (Bactroban) as instructed Prim Dressing: PolyMem Silver Non-Adhesive Dressing, 4.25x4.25 in 1 x Per Week/30 Days ary Discharge Instructions: Apply to wound bed as instructed Secondary Dressing: ABD Pad, 8x10 1 x Per Week/30 Days Discharge Instructions: Apply over primary dressing as directed. Com pression Wrap: ThreePress (3 layer compression wrap) 1 x Per Week/30 Days Discharge Instructions: Apply three layer compression as directed. 1. Gentamicin/mupirocin with PolyMem silver under 3 layer compression 2. Information given to the patient to order compression stockings Electronic  Signature(s) Signed: 04/20/2022 2:02:17 PM By: [name] Corwin DO Entered By: [name] [name] on 04/20/2022 14:01:32 -------------------------------------------------------------------------------- HxROS Details Patient Name: Date of Service: [name] [name] [place] A. 04/20/2022 1:30 PM Medical Record Number: [id number] Patient Account Number: [id number] Date of Birth/Sex: Treating RN: [birth year] (46 y.o. [name] [name] Primary Care Provider: PCP, NO Other Clinician: Referring Provider: Treating Provider/Extender: [name] [name] in Treatment: 4 Information Obtained From Chart Cardiovascular Medical History: Positive for: Hypertension Endocrine Medical History: Positive for: Type II Diabetes Immunizations Pneumococcal Vaccine: Received Pneumococcal Vaccination: Yes Received Pneumococcal Vaccination On or After 60th Birthday: No Implantable Devices None Family and Social History Cancer: No; Diabetes: Yes - Siblings; Heart Disease: Yes - Siblings; Hereditary Spherocytosis: No; Hypertension: No; Kidney Disease: No; Lung Disease: No; Seizures: No; Stroke: No; Thyroid Problems: No; Tuberculosis: No; Never smoker; Marital Status - Single; Alcohol Use: Never; Drug Use: No History; Caffeine Use: Daily; Financial Concerns: Yes; Food, Clothing or Shelter Needs: No; Support System Lacking: No; Transportation  Concerns: No Electronic Signature(s) Signed: 04/20/2022 2:02:17 PM By: [name] Corwin DO Signed: 04/20/2022 4:29:33 PM By: Shawn Stall RN, BSN Entered By: [name] [name] on 04/20/2022 13:59:20 -------------------------------------------------------------------------------- SuperBill Details Patient Name: Date of Service: [name] [name] [place] A. 04/20/2022 Medical Record Number: [id number] Patient Account Number: [id number] Date of Birth/Sex: Treating RN: Feb 18, 1976 (46 y.o. 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

## 2022-04-21 NOTE — Progress Notes (Signed)
JEROD, MCQUAIN ([id number]) Visit Report for 04/20/2022 Arrival Information Details Patient Name: Date of Service: Lenord Carbo [name] 04/20/2022 1:30 PM Medical Record Number: [id number] Patient Account Number: [id number] Date of Birth/Sex: Treating RN: 1976/03/08 (46 y.o. Lorette Ang, Meta.Reding Primary Care Provider: PCP, NO Other Clinician: Referring Provider: Treating Provider/Extender: Yaakov Guthrie in Treatment: 4 Visit Information History Since Last Visit Added or deleted any medications: No Patient Arrived: Ambulatory Any new allergies or adverse reactions: No Arrival Time: 13:28 Had a fall or experienced change in No Accompanied By: interpreter activities of daily living that may affect Transfer Assistance: None risk of falls: Patient Requires Transmission-Based Precautions: No Signs or symptoms of abuse/neglect since last visito No Patient Has Alerts: Yes Hospitalized since last visit: No Patient Alerts: Translator Required Implantable device outside of the clinic excluding No cellular tissue based products placed in the center since last visit: Has Dressing in Place as Prescribed: Yes Has Compression in Place as Prescribed: Yes Pain Present Now: No Electronic Signature(s) Signed: 04/21/2022 10:34:18 AM By: [name] Entered By: [name] on 04/20/2022 13:29:24 -------------------------------------------------------------------------------- Compression Therapy Details Patient Name: Date of Service: Lenord Carbo [place] A. 04/20/2022 1:30 PM Medical Record Number: [id number] Patient Account Number: [id number] Date of Birth/Sex: Treating RN: Aug 29, 1976 (46 y.o. [name] Primary Care Provider: PCP, NO Other Clinician: Referring Provider: Treating Provider/Extender: Yaakov Guthrie in Treatment: 4 Compression Therapy Performed for Wound Assessment: Wound #1 Left,Proximal,Lateral Lower Leg Performed By: Clinician [name], RN Compression Type: Three  Layer Post Procedure Diagnosis Same as Pre-procedure Electronic Signature(s) Signed: 04/20/2022 4:29:33 PM By: [name] RN, BSN Entered By: [name] on 04/20/2022 13:56:08 -------------------------------------------------------------------------------- Compression Therapy Details Patient Name: Date of Service: Lenord Carbo [name] 04/20/2022 1:30 PM Medical Record Number: [id number] Patient Account Number: [id number] Date of Birth/Sex: Treating RN: [birth year] (46 y.o. [name] Primary Care Provider: PCP, NO Other Clinician: Referring Provider: Treating Provider/Extender: Yaakov Guthrie in Treatment: 4 Compression Therapy Performed for Wound Assessment: Wound #2 Left,Distal,Lateral Lower Leg Performed By: Clinician [name], RN Compression Type: Three Layer Post Procedure Diagnosis Same as Pre-procedure Electronic Signature(s) Signed: 04/20/2022 4:29:33 PM By: [name] RN, BSN Entered By: [name] on 04/20/2022 13:56:08 -------------------------------------------------------------------------------- Compression Therapy Details Patient Name: Date of Service: Lenord Carbo [name] 04/20/2022 1:30 PM Medical Record Number: [id number] Patient Account Number: [id number] Date of Birth/Sex: Treating RN: [birth year] (46 y.o. [name] Primary Care Provider: PCP, NO Other Clinician: Referring Provider: Treating Provider/Extender: Yaakov Guthrie in Treatment: 4 Compression Therapy Performed for Wound Assessment: Wound #3 Left,Anterior Lower Leg Performed By: Clinician [name], RN Compression Type: Three Layer Post Procedure Diagnosis Same as Pre-procedure Electronic Signature(s) Signed: 04/20/2022 4:29:33 PM By: [name] RN, BSN Entered By: [name] on 04/20/2022 13:56:08 -------------------------------------------------------------------------------- Encounter Discharge Information Details Patient Name: Date of  Service: Lenord Carbo [name] 04/20/2022 1:30 PM Medical Record Number: [id number] Patient Account Number: [id number] Date of Birth/Sex: Treating RN: [birth year] (46 y.o. [name] Primary Care Provider: PCP, NO Other Clinician: Referring Provider: Treating Provider/Extender: Yaakov Guthrie in Treatment: 4 Encounter Discharge Information Items Discharge Condition: Stable Ambulatory Status: Ambulatory Discharge Destination: Home Transportation: Private Auto Accompanied By: self Schedule Follow-up Appointment: Yes Clinical Summary of Care: Electronic Signature(s) Signed: 04/20/2022 4:29:33 PM By: [name] RN, BSN Entered By: [name] on 04/20/2022 13:59:11 -------------------------------------------------------------------------------- Lower Extremity Assessment Details Patient Name: Date of Service: [name] 04/20/2022 1:30 PM Medical Record Number: [id number]  Patient Account Number: [id number] Date of Birth/Sex: Treating RN: 11-13-1975 (46 y.o. [name] Primary Care Provider: PCP, NO Other Clinician: Referring Provider: Treating Provider/Extender: Yaakov Guthrie in Treatment: 4 Edema Assessment Assessed: [Left: Yes] [Right: No] Edema: [Left: Ye] [Right: s] Calf Left: Right: Point of Measurement: From Medial Instep 38.5 cm Ankle Left: Right: Point of Measurement: From Medial Instep 23.2 cm Knee To Floor Left: Right: From Medial Instep 47 cm Vascular Assessment Pulses: Dorsalis Pedis Palpable: [Left:Yes] Electronic Signature(s) Signed: 04/20/2022 4:29:33 PM By: [name] RN, BSN Entered By: [name] on 04/20/2022 13:56:42 -------------------------------------------------------------------------------- Multi Wound Chart Details Patient Name: Date of Service: Massie [place] A. 04/20/2022 1:30 PM Medical Record Number: [id number] Patient Account Number: [id number] Date of Birth/Sex: Treating RN: 09-22-1976 (46 y.o. [name] Primary Care Provider: PCP, NO Other Clinician: Referring Provider: Treating Provider/Extender: Yaakov Guthrie in Treatment: 4 Vital Signs Height(in): Pulse(bpm): 105 Weight(lbs): 205 Blood Pressure(mmHg): 154/89 Body Mass Index(BMI): Temperature(F): 98.4 Respiratory Rate(breaths/min): 18 Photos: Left, Proximal, Lateral Lower Leg Left, Distal, Lateral Lower Leg Left, Anterior Lower Leg Wound Location: Gradually Appeared Gradually Appeared Gradually Appeared Wounding Event: Atypical Venous Leg Ulcer Venous Leg Ulcer Primary Etiology: Hypertension, Type II Diabetes Hypertension, Type II Diabetes Hypertension, Type II Diabetes Comorbid History: 01/10/2022 01/10/2022 04/06/2022 Date Acquired: _0 Weeks of Treatment: Open Open Open Wound Status: No No No Wound Recurrence: 0.1x0.1x0.1 1.1x0.9x0.1 0.2x0.2x0.1 Measurements L x W x D (cm) 0.008 0.778 0.031 A (cm) : rea 0.001 0.078 0.003 Volume (cm) : 99.20% 72.50% 84.20% % Reduction in A rea: 99.00% 72.40% 92.30% % Reduction in Volume: Full Thickness Without Exposed Full Thickness Without Exposed Full Thickness Without Exposed Classification: Support Structures Support Structures Support Structures Medium Medium Medium Exudate Amount: N/A N/A Serosanguineous Exudate Type: N/A N/A red, brown Exudate Color: Flat and Intact Flat and Intact Distinct, outline attached Wound Margin: Medium (34-66%) Large (67-100%) Large (67-100%) Granulation Amount: Red, Pink Pink Red Granulation Quality: Medium (34-66%) Small (1-33%) None Present (0%) Necrotic Amount: Fat Layer (Subcutaneous Tissue): Yes Fat Layer (Subcutaneous Tissue): Yes Fat Layer (Subcutaneous Tissue): Yes Exposed Structures: Fascia: No Fascia: No Fascia: No Tendon: No Tendon: No Tendon: No Muscle: No Muscle: No Muscle: No Joint: No Joint: No Joint: No Bone: No Bone: No Bone: No None Medium (34-66%)  None Epithelialization: Compression Therapy Compression Therapy Compression Therapy Procedures Performed: Treatment Notes Electronic Signature(s) Signed: 04/20/2022 2:02:17 PM By: Kalman Shan DO Signed: 04/20/2022 4:29:33 PM By: [name] RN, BSN Entered By: Kalman Shan on 04/20/2022 13:58:21 -------------------------------------------------------------------------------- Multi-Disciplinary Care Plan Details Patient Name: Date of Service: Lenord Carbo [name] 04/20/2022 1:30 PM Medical Record Number: [id number] Patient Account Number: [id number] Date of Birth/Sex: Treating RN: 1976/08/13 (46 y.o. [name] Primary Care Provider: PCP, NO Other Clinician: Referring Provider: Treating Provider/Extender: Yaakov Guthrie in Treatment: 4 Active Inactive Pain, Acute or Chronic Nursing Diagnoses: Pain, acute or chronic: actual or potential Potential alteration in comfort, pain Goals: Patient will verbalize adequate pain control and receive pain control interventions during procedures as needed Date Initiated: 03/17/2022 Target Resolution Date: 05/08/2022 Goal Status: Active Patient/caregiver will verbalize comfort level met Date Initiated: 03/17/2022 Target Resolution Date: 05/08/2022 Goal Status: Active Interventions: Encourage patient to take pain medications as prescribed Provide education on pain management Reposition patient for comfort Treatment Activities: Administer pain control measures as ordered : 03/17/2022 Notes: Wound/Skin Impairment Nursing Diagnoses: Knowledge deficit related to ulceration/compromised skin integrity Goals: Patient/caregiver will verbalize understanding of skin  care regimen Date Initiated: 03/17/2022 Target Resolution Date: 05/08/2022 Goal Status: Active Interventions: Assess patient/caregiver ability to perform ulcer/skin care regimen upon admission and as needed Assess ulceration(s) every visit Provide education on ulcer and  skin care Treatment Activities: Skin care regimen initiated : 03/17/2022 Topical wound management initiated : 03/17/2022 Notes: Electronic Signature(s) Signed: 04/20/2022 4:29:33 PM By: [name] RN, BSN Entered By: [name] on 04/20/2022 13:53:03 -------------------------------------------------------------------------------- Pain Assessment Details Patient Name: Date of Service: Lenord Carbo [place] A. 04/20/2022 1:30 PM Medical Record Number: [id number] Patient Account Number: [id number] Date of Birth/Sex: Treating RN: Jan 06, 1976 (46 y.o. [name] Primary Care Provider: PCP, NO Other Clinician: Referring Provider: Treating Provider/Extender: Yaakov Guthrie in Treatment: 4 Active Problems Location of Pain Severity and Description of Pain Patient Has Paino No Site Locations Pain Management and Medication Current Pain Management: Electronic Signature(s) Signed: 04/20/2022 4:29:33 PM By: [name] RN, BSN Signed: 04/21/2022 10:34:18 AM By: [name] Entered By: [name] on 04/20/2022 13:30:13 -------------------------------------------------------------------------------- Patient/Caregiver Education Details Patient Name: Date of Service: [name] 7/10/2023andnbsp1:30 PM Medical Record Number: [id number] Patient Account Number: [id number] Date of Birth/Gender: Treating RN: March 15, 1976 (46 y.o. [name] Primary Care Physician: PCP, NO Other Clinician: Referring Physician: Treating Physician/Extender: Yaakov Guthrie in Treatment: 4 Education Assessment Education Provided To: Patient Education Topics Provided Wound/Skin Impairment: Handouts: Skin Care Do's and Dont's Methods: Explain/Verbal Responses: Reinforcements needed Electronic Signature(s) Signed: 04/20/2022 4:29:33 PM By: [name] RN, BSN Entered By: [name] on 04/20/2022  13:53:15 -------------------------------------------------------------------------------- Wound Assessment Details Patient Name: Date of Service: Lenord Carbo [place] A. 04/20/2022 1:30 PM Medical Record Number: [id number] Patient Account Number: [id number] Date of Birth/Sex: Treating RN: 06-26-1976 (46 y.o. [name] Primary Care Provider: PCP, NO Other Clinician: Referring Provider: Treating Provider/Extender: Yaakov Guthrie in Treatment: 4 Wound Status Wound Number: 1 Primary Etiology: Atypical Wound Location: Left, Proximal, Lateral Lower Leg Wound Status: Open Wounding Event: Gradually Appeared Comorbid History: Hypertension, Type II Diabetes Date Acquired: 01/10/2022 Weeks Of Treatment: 4 Clustered Wound: No Photos Wound Measurements Length: (cm) 0.1 Width: (cm) 0.1 Depth: (cm) 0.1 Area: (cm) 0.008 Volume: (cm) 0.001 % Reduction in Area: 99.2% % Reduction in Volume: 99% Epithelialization: None Tunneling: No Undermining: No Wound Description Classification: Full Thickness Without Exposed Support Structures Wound Margin: Flat and Intact Exudate Amount: Medium Foul Odor After Cleansing: No Slough/Fibrino Yes Wound Bed Granulation Amount: Medium (34-66%) Exposed Structure Granulation Quality: Red, Pink Fascia Exposed: No Necrotic Amount: Medium (34-66%) Fat Layer (Subcutaneous Tissue) Exposed: Yes Necrotic Quality: Adherent Slough Tendon Exposed: No Muscle Exposed: No Joint Exposed: No Bone Exposed: No Treatment Notes Wound #1 (Lower Leg) Wound Laterality: Left, Lateral, Proximal Cleanser Soap and Water Discharge Instruction: May shower and wash wound with dial antibacterial soap and water prior to dressing change. Wound Cleanser Discharge Instruction: Cleanse the wound with wound cleanser prior to applying a clean dressing using gauze sponges, not tissue or cotton balls. Peri-Wound Care Triamcinolone 15 (g) Discharge Instruction: Use  triamcinolone 15 (g) as directed Sween Lotion (Moisturizing lotion) Discharge Instruction: Apply moisturizing lotion as directed Topical Gentamicin Discharge Instruction: As directed by physician mix with mupirocin. Mupirocin Ointment Discharge Instruction: Apply Mupirocin (Bactroban) as instructed Primary Dressing PolyMem Silver Non-Adhesive Dressing, 4.25x4.25 in Discharge Instruction: Apply to wound bed as instructed Secondary Dressing ABD Pad, 8x10 Discharge Instruction: Apply over primary dressing as directed. Secured With Compression Wrap ThreePress (3 layer compression wrap) Discharge Instruction: Apply three layer compression as directed. Compression  Stockings Environmental education officer) Signed: 04/20/2022 4:29:33 PM By: [name] RN, BSN Signed: 04/20/2022 4:44:20 PM By: Rhae Hammock RN Entered By: Rhae Hammock on 04/20/2022 13:44:12 -------------------------------------------------------------------------------- Wound Assessment Details Patient Name: Date of Service: Lenord Carbo [name] 04/20/2022 1:30 PM Medical Record Number: [id number] Patient Account Number: [id number] Date of Birth/Sex: Treating RN: 01/14/1976 (46 y.o. [name] Primary Care Wane Mollett: PCP, NO Other Clinician: Referring Tashea Othman: Treating Shatira Dobosz/Extender: Yaakov Guthrie in Treatment: 4 Wound Status Wound Number: 2 Primary Etiology: Venous Leg Ulcer Wound Location: Left, Distal, Lateral Lower Leg Wound Status: Open Wounding Event: Gradually Appeared Comorbid History: Hypertension, Type II Diabetes Date Acquired: 01/10/2022 Weeks Of Treatment: 4 Clustered Wound: No Photos Wound Measurements Length: (cm) 1.1 Width: (cm) 0.9 Depth: (cm) 0.1 Area: (cm) 0.778 Volume: (cm) 0.078 % Reduction in Area: 72.5% % Reduction in Volume: 72.4% Epithelialization: Medium (34-66%) Tunneling: No Undermining: No Wound Description Classification: Full Thickness Without  Exposed Support Structures Wound Margin: Flat and Intact Exudate Amount: Medium Foul Odor After Cleansing: No Slough/Fibrino No Wound Bed Granulation Amount: Large (67-100%) Exposed Structure Granulation Quality: Pink Fascia Exposed: No Necrotic Amount: Small (1-33%) Fat Layer (Subcutaneous Tissue) Exposed: Yes Necrotic Quality: Adherent Slough Tendon Exposed: No Muscle Exposed: No Joint Exposed: No Bone Exposed: No Treatment Notes Wound #2 (Lower Leg) Wound Laterality: Left, Lateral, Distal Cleanser Soap and Water Discharge Instruction: May shower and wash wound with dial antibacterial soap and water prior to dressing change. Wound Cleanser Discharge Instruction: Cleanse the wound with wound cleanser prior to applying a clean dressing using gauze sponges, not tissue or cotton balls. Peri-Wound Care Triamcinolone 15 (g) Discharge Instruction: Use triamcinolone 15 (g) as directed Sween Lotion (Moisturizing lotion) Discharge Instruction: Apply moisturizing lotion as directed Topical Gentamicin Discharge Instruction: As directed by physician mix with mupirocin. Mupirocin Ointment Discharge Instruction: Apply Mupirocin (Bactroban) as instructed Primary Dressing PolyMem Silver Non-Adhesive Dressing, 4.25x4.25 in Discharge Instruction: Apply to wound bed as instructed Secondary Dressing ABD Pad, 8x10 Discharge Instruction: Apply over primary dressing as directed. Secured With Compression Wrap ThreePress (3 layer compression wrap) Discharge Instruction: Apply three layer compression as directed. Compression Stockings Add-Ons Electronic Signature(s) Signed: 04/20/2022 4:29:33 PM By: [name] RN, BSN Signed: 04/20/2022 4:44:20 PM By: Rhae Hammock RN Entered By: Rhae Hammock on 04/20/2022 13:44:41 -------------------------------------------------------------------------------- Wound Assessment Details Patient Name: Date of Service: Lenord Carbo [name] 04/20/2022  1:30 PM Medical Record Number: [id number] Patient Account Number: [id number] Date of Birth/Sex: Treating RN: Apr 01, 1976 (46 y.o. [name] Primary Care Jawan Chavarria: PCP, NO Other Clinician: Referring Jimy Gates: Treating Hildegarde Dunaway/Extender: Yaakov Guthrie in Treatment: 4 Wound Status Wound Number: 3 Primary Etiology: Venous Leg Ulcer Wound Location: Left, Anterior Lower Leg Wound Status: Open Wounding Event: Gradually Appeared Comorbid History: Hypertension, Type II Diabetes Date Acquired: 04/06/2022 Weeks Of Treatment: 2 Clustered Wound: No Photos Wound Measurements Length: (cm) 0.2 Width: (cm) 0.2 Depth: (cm) 0.1 Area: (cm) 0.031 Volume: (cm) 0.003 % Reduction in Area: 84.2% % Reduction in Volume: 92.3% Epithelialization: None Tunneling: No Undermining: No Wound Description Classification: Full Thickness Without Exposed Support Structures Wound Margin: Distinct, outline attached Exudate Amount: Medium Exudate Type: Serosanguineous Exudate Color: red, brown Foul Odor After Cleansing: No Slough/Fibrino No Wound Bed Granulation Amount: Large (67-100%) Exposed Structure Granulation Quality: Red Fascia Exposed: No Necrotic Amount: None Present (0%) Fat Layer (Subcutaneous Tissue) Exposed: Yes Tendon Exposed: No Muscle Exposed: No Joint Exposed: No Bone Exposed: No Treatment Notes Wound #3 (Lower Leg) Wound Laterality: Left, Anterior Cleanser  Soap and Water Discharge Instruction: May shower and wash wound with dial antibacterial soap and water prior to dressing change. Wound Cleanser Discharge Instruction: Cleanse the wound with wound cleanser prior to applying a clean dressing using gauze sponges, not tissue or cotton balls. Peri-Wound Care Triamcinolone 15 (g) Discharge Instruction: Use triamcinolone 15 (g) as directed Sween Lotion (Moisturizing lotion) Discharge Instruction: Apply moisturizing lotion as directed Topical Gentamicin Discharge  Instruction: As directed by physician mix with mupirocin. Mupirocin Ointment Discharge Instruction: Apply Mupirocin (Bactroban) as instructed Primary Dressing PolyMem Silver Non-Adhesive Dressing, 4.25x4.25 in Discharge Instruction: Apply to wound bed as instructed Secondary Dressing ABD Pad, 8x10 Discharge Instruction: Apply over primary dressing as directed. Secured With Compression Wrap ThreePress (3 layer compression wrap) Discharge Instruction: Apply three layer compression as directed. Compression Stockings Add-Ons Electronic Signature(s) Signed: 04/20/2022 4:29:33 PM By: [name] RN, BSN Signed: 04/20/2022 4:44:20 PM By: Rhae Hammock RN Entered By: Rhae Hammock on 04/20/2022 13:45:03 -------------------------------------------------------------------------------- Vitals Details Patient Name: Date of Service: Lenord Carbo [name] 04/20/2022 1:30 PM Medical Record Number: [id number] Patient Account Number: [id number] Date of Birth/Sex: Treating RN: December 05, 1975 (46 y.o. [name] Primary Care Ashlyne Olenick: PCP, NO Other Clinician: Referring Elbony Mcclimans: Treating Isabella Roemmich/Extender: Yaakov Guthrie in Treatment: 4 Vital Signs Time Taken: 13:30 Temperature (F): 98.4 Weight (lbs): 205 Pulse (bpm): 105 Respiratory Rate (breaths/min): 18 Blood Pressure (mmHg): 154/89 Reference Range: 80 - 120 mg / dl Electronic Signature(s) Signed: 04/21/2022 10:34:18 AM By: [name] Entered By: [name] on 04/20/2022 13:30:08

## 2022-04-27 ENCOUNTER — Encounter (HOSPITAL_BASED_OUTPATIENT_CLINIC_OR_DEPARTMENT_OTHER): Payer: Self-pay | Admitting: Internal Medicine

## 2022-04-27 DIAGNOSIS — L97828 Non-pressure chronic ulcer of other part of left lower leg with other specified severity: Secondary | ICD-10-CM

## 2022-04-27 DIAGNOSIS — I87312 Chronic venous hypertension (idiopathic) with ulcer of left lower extremity: Secondary | ICD-10-CM

## 2022-04-27 NOTE — Progress Notes (Signed)
SIRIS, HOOS ([id number]) Visit Report for 04/27/2022 Chief Complaint Document Details Patient Name: Date of Service: [name] [name] [place] A. 04/27/2022 2:15 PM Medical Record Number: [id number] Patient Account Number: [id number] Date of Birth/Sex: Treating RN: 15-Nov-1975 (46 y.o. [name] [name] Primary Care Provider: PCP, NO Other Clinician: Referring Provider: Treating Provider/Extender: Tilda Franco in Treatment: 5 Information Obtained from: Patient Chief Complaint 03/17/2022; patient is here for review of wounds on the left lateral lower leg Electronic Signature(s) Signed: 04/27/2022 3:52:49 PM By: Geralyn Corwin DO Entered By: Geralyn Corwin on 04/27/2022 14:15:39 -------------------------------------------------------------------------------- HPI Details Patient Name: Date of Service: [name] [name] [place]. 04/27/2022 2:15 PM Medical Record Number: [id number] Patient Account Number: [id number] Date of Birth/Sex: Treating RN: [birth year] (46 y.o. [name] [name] Primary Care Provider: PCP, NO Other Clinician: Referring Provider: Treating Provider/Extender: Tilda Franco in Treatment: 5 History of Present Illness HPI Description: ADMISSION 03/17/2022 This is a 46 year old man who works at [employer]. For the last 2 months he has had wounds on the left lateral lower leg that are painful making it difficult for him to sleep. He has been seen in urgent care 2 times where the area on the left lateral leg is often described as a rash and in March as a scaly rash that look like psoriasis to the doctor that was seeing him. He was seen in the ER on 510 given a course of prednisone and doxycycline which she has just finished. Again was seen in the ER on 6/4 and was referred here and also to dermatology. He has not heard anything about a dermatology consult. He does not describe a more systemic rash or wounds or a history of wounds in any other site than the left lateral leg. He  has 2 wounds on the left lateral leg he has been treating this with gentian violet Past medical history includes hypertension, rashes on the left lower leg that seem to go back to 2021. During the stay in the ER on 5/10 he had lab work that included a reasonably normal comprehensive metabolic panel except a glucose of 162. T bilirubin of 1.8 slightly elevated. More problematically he had a otal white count of 2.9 hemoglobin was normal at 15.3 but a platelet count of only 43,000. His differential count was reasonably normal. Sed rate of 41. Also had slight macrocytosis His ABI in our clinic was 0.95 03/23/2022; patient presents for follow-up. Dr. Leanord Hawking admitted him last week. He obtained a CBC with differential, BMP and vitamin B12 due to abnormal lab results done in the ED 1 month ago. White blood cell count returned to normal along with the MCV. His vitamin B12 was normal. 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 He also obtained a punch biopsy that showed no specific causative pathologic process identified. It was negative for fungal organisms. He has been using collagen under 3 layer compression. 6/26; patient presents for follow-up. We have been using gentamicin/mupirocin with PolyMem silver under 3 layer compression. We did a PCR culture at last clinic visit that grew Extra high levels of E. coli and medium levels of Staph aureus. We sent for [facility] antibiotics however his insurance is not active currently. He was unable to obtain this. He currently denies signs of infection. 7/10; patient presents for follow-up. We have been using gentamicin and mupirocin under 3 layer compression with PolyMem silver. He has not ordered [facility] antibiotics yet due to insurance issues. He currently  denies signs of infection. 7/17; patient presents for follow-up. We have been using gentamicin and mupirocin with PolyMem silver under 3 layer compression. Patient has done  well. His wounds have healed. He has his compression stockings with him today. Electronic Signature(s) Signed: 04/27/2022 3:52:49 PM By: Geralyn Corwin DO Entered By: Geralyn Corwin on 04/27/2022 14:16:12 -------------------------------------------------------------------------------- Physical Exam Details Patient Name: Date of Service: [name] [name] [place] A. 04/27/2022 2:15 PM Medical Record Number: [id number] Patient Account Number: [id number] Date of Birth/Sex: Treating RN: 1976-07-28 (46 y.o. [name] [name] Primary Care Provider: PCP, NO Other Clinician: Referring Provider: Treating Provider/Extender: Tilda Franco in Treatment: 5 Constitutional respirations regular, non-labored and within target range for patient.. Cardiovascular 2+ dorsalis pedis/posterior tibialis pulses. Psychiatric pleasant and cooperative. Notes Left lower extremity: Epithelization to the previous wound sites. Good edema control. Hemosiderin staining throughout. No signs of surrounding infection. Electronic Signature(s) Signed: 04/27/2022 3:52:49 PM By: Geralyn Corwin DO Entered By: Geralyn Corwin on 04/27/2022 14:16:45 -------------------------------------------------------------------------------- Physician Orders Details Patient Name: Date of Service: [name] [name] [place] A. 04/27/2022 2:15 PM Medical Record Number: [id number] Patient Account Number: [id number] Date of Birth/Sex: Treating RN: [birth year] (46 y.o. [name] [name] Primary Care Provider: PCP, NO Other Clinician: Referring Provider: Treating Provider/Extender: Tilda Franco in Treatment: 5 Verbal / Phone Orders: No Diagnosis Coding ICD-10 Coding Code Description [phone] Non-pressure chronic ulcer of other part of left lower leg with other specified severity I87.312 Chronic venous hypertension (idiopathic) with ulcer of left lower extremity Discharge From [facility] Services Discharge from Wound Care Center - Call if any future  wound care needs. Edema Control - Lymphedema / SCD / Other Elevate legs to the level of the heart or above for 30 minutes daily and/or when sitting, a frequency of: - 3-4 times a day throughout the day. Exercise regularly Moisturize legs daily. - both legs every night before bed. Compression stocking or Garment 20-30 mm/Hg pressure to: - Apply in the morning and remove at night. Replace every 6-8 months. Wear stockings for life. Electronic Signature(s) Signed: 04/27/2022 3:52:49 PM By: Geralyn Corwin DO Entered By: Geralyn Corwin on 04/27/2022 14:16:50 -------------------------------------------------------------------------------- Problem List Details Patient Name: Date of Service: [name] [name] [place] A. 04/27/2022 2:15 PM Medical Record Number: [id number] Patient Account Number: [id number] Date of Birth/Sex: Treating RN: October 01, 1976 (46 y.o. [name] [name] Primary Care Provider: PCP, NO Other Clinician: Referring Provider: Treating Provider/Extender: Tilda Franco in Treatment: 5 Active Problems ICD-10 Encounter Code Description Active Date MDM Diagnosis L97.828 Non-pressure chronic ulcer of other part of left lower leg with other specified 03/17/2022 No Yes severity I87.312 Chronic venous hypertension (idiopathic) with ulcer of left lower extremity 04/06/2022 No Yes Inactive Problems Resolved Problems Electronic Signature(s) Signed: 04/27/2022 3:52:49 PM By: Geralyn Corwin DO Entered By: Geralyn Corwin on 04/27/2022 14:15:27 -------------------------------------------------------------------------------- Progress Note Details Patient Name: Date of Service: [name] [name] [place] A. 04/27/2022 2:15 PM Medical Record Number: [id number] Patient Account Number: [id number] Date of Birth/Sex: Treating RN: October 21, 1975 (46 y.o. [name] [name] Primary Care Provider: PCP, NO Other Clinician: Referring Provider: Treating Provider/Extender: Tilda Franco in Treatment:  5 Subjective Chief Complaint Information obtained from Patient 03/17/2022; patient is here for review of wounds on the left lateral lower leg History of Present Illness (HPI) ADMISSION 03/17/2022 This is a 46 year old man who works at [employer]. For the last 2 months he has had wounds on the left lateral lower leg that are painful making it difficult for him to sleep. He has been seen in  urgent care 2 times where the area on the left lateral leg is often described as a rash and in March as a scaly rash that look like psoriasis to the doctor that was seeing him. He was seen in the ER on 510 given a course of prednisone and doxycycline which she has just finished. Again was seen in the ER on 6/4 and was referred here and also to dermatology. He has not heard anything about a dermatology consult. He does not describe a more systemic rash or wounds or a history of wounds in any other site than the left lateral leg. He has 2 wounds on the left lateral leg he has been treating this with gentian violet Past medical history includes hypertension, rashes on the left lower leg that seem to go back to 2021. During the stay in the ER on 5/10 he had lab work that included a reasonably normal comprehensive metabolic panel except a glucose of 162. T bilirubin of 1.8 slightly elevated. More problematically he had a otal white count of 2.9 hemoglobin was normal at 15.3 but a platelet count of only 43,000. His differential count was reasonably normal. Sed rate of 41. Also had slight macrocytosis His ABI in our clinic was 0.95 03/23/2022; patient presents for follow-up. Dr. Leanord Hawkingobson admitted him last week. He obtained a CBC with differential, BMP and vitamin B12 due to abnormal lab results done in the ED 1 month ago. White blood cell count returned to normal along with the MCV. His vitamin B12 was normal. 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 He also obtained a punch  biopsy that showed no specific causative pathologic process identified. It was negative for fungal organisms. He has been using collagen under 3 layer compression. 6/26; patient presents for follow-up. We have been using gentamicin/mupirocin with PolyMem silver under 3 layer compression. We did a PCR culture at last clinic visit that grew Extra high levels of E. coli and medium levels of Staph aureus. [facility] not [facility] currently. He was unable to obtain this. He currently denies signs of infection. 7/10; patient presents for follow-up. We have been using gentamicin and mupirocin under 3 layer compression with PolyMem silver. He has not ordered [facility] antibiotics yet due to insurance issues. He currently denies signs of infection. 7/17; patient presents for follow-up. We have been using gentamicin and mupirocin with PolyMem silver under 3 layer compression. Patient has done well. His wounds have healed. He has his compression stockings with him today. Patient History Information obtained from Chart. Family History Diabetes - Siblings, Heart Disease - Siblings, No family history of Cancer, Hereditary Spherocytosis, Hypertension, Kidney Disease, Lung Disease, Seizures, Stroke, Thyroid Problems, Tuberculosis. Social History Never smoker, Marital Status - Single, Alcohol Use - Never, Drug Use - No History, Caffeine Use - Daily. Medical History Cardiovascular Patient has history of Hypertension Endocrine Patient has history of Type II Diabetes Objective Constitutional respirations regular, non-labored and within target range for patient.. Vitals Time Taken: 1:55 PM, Weight: 205 lbs, Temperature: 98.9 F, Pulse: 77 bpm, Respiratory Rate: 20 breaths/min, Blood Pressure: 135/81 mmHg. Cardiovascular 2+ dorsalis pedis/posterior tibialis pulses. Psychiatric pleasant and cooperative. General Notes: Left lower extremity: Epithelization to the  previous wound sites. Good edema control. Hemosiderin staining throughout. No signs of surrounding infection. Integumentary (Hair, Skin) Wound #1 status is Open. Original cause of wound was Gradually Appeared. The date acquired was: 01/10/2022. The wound  has been in treatment 5 weeks. The wound is located on the Left,Proximal,Lateral Lower Leg. The wound measures 0cm length x 0cm width x 0cm depth; 0cm^2 area and 0cm^3 volume. There is no tunneling or undermining noted. There is a none present amount of drainage noted. The wound margin is flat and intact. There is no granulation within the wound bed. There is no necrotic tissue within the wound bed. Wound #2 status is Open. Original cause of wound was Gradually Appeared. The date acquired was: 01/10/2022. The wound has been in treatment 5 weeks. The wound is located on the Left,Distal,Lateral Lower Leg. The wound measures 0cm length x 0cm width x 0cm depth; 0cm^2 area and 0cm^3 volume. There is no tunneling or undermining noted. There is a none present amount of drainage noted. The wound margin is flat and intact. There is no granulation within the wound bed. There is no necrotic tissue within the wound bed. Wound #3 status is Open. Original cause of wound was Gradually Appeared. The date acquired was: 04/06/2022. The wound has been in treatment 3 weeks. The wound is located on the Left,Anterior Lower Leg. The wound measures 0cm length x 0cm width x 0cm depth; 0cm^2 area and 0cm^3 volume. There is no tunneling or undermining noted. There is a none present amount of drainage noted. The wound margin is distinct with the outline attached to the wound base. There is no granulation within the wound bed. There is no necrotic tissue within the wound bed. 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 I recommended he wear his compression stockings daily. He has these with him today. We will place these in office. He may follow-up as needed. Plan Discharge From [facility] Services: Discharge from Wound Care Center - Call if any future wound care needs. Edema Control - Lymphedema / SCD / Other: Elevate legs to the level of the heart or above for 30 minutes daily and/or when sitting, a frequency of: - 3-4 times a day throughout the day. Exercise regularly Moisturize legs daily. - both legs every night before bed. Compression stocking or Garment 20-30 mm/Hg pressure to: - Apply in the morning and remove at night. Replace every 6-8 months. Wear stockings for life. 1. Daily compression stockings 2. Discharge from clinic due to closed wound 3. Follow-up as needed Electronic Signature(s) Signed: 04/27/2022 3:52:49 PM By: Geralyn Corwin DO Entered By: Geralyn Corwin on 04/27/2022 14:17:26 -------------------------------------------------------------------------------- HxROS Details Patient Name: Date of Service: [name] [name] [place] A. 04/27/2022 2:15 PM Medical Record Number: [id number] Patient Account Number: [id number] Date of Birth/Sex: Treating RN: 01/02/1976 (46 y.o. [name] [name] Primary Care Provider: PCP, NO Other Clinician: Referring Provider: Treating Provider/Extender: Tilda Franco in Treatment: 5 Information Obtained From Chart Cardiovascular Medical History: Positive for: Hypertension Endocrine Medical History: Positive for: Type II Diabetes Immunizations Pneumococcal Vaccine: Received Pneumococcal Vaccination: Yes Received Pneumococcal Vaccination On or After 60th Birthday: No Implantable Devices None Family and Social History Cancer: No; Diabetes: Yes - Siblings; Heart Disease: Yes - Siblings; Hereditary Spherocytosis: No; Hypertension: No; Kidney Disease: No; Lung Disease: No;  Seizures: No; Stroke: No; Thyroid Problems: No; Tuberculosis: No; Never smoker; Marital Status - Single; Alcohol Use: Never; Drug Use: No History; Caffeine Use: Daily; Financial Concerns: Yes; Food, Clothing or Shelter Needs: No; Support System Lacking:  No; Transportation Concerns: No Electronic Signature(s) Signed: 04/27/2022 3:51:06 PM By: Shawn Stall RN, BSN Signed: 04/27/2022 3:52:49 PM By: Geralyn Corwin DO Entered By: Geralyn Corwin on 04/27/2022 14:16:18 -------------------------------------------------------------------------------- SuperBill Details Patient Name: Date of Service: [name] [name] [place] A. 04/27/2022 Medical Record Number: [id number] Patient Account Number: [id number] Date of Birth/Sex: Treating RN: 08-03-1976 (46 y.o. 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

## 2022-04-27 NOTE — Progress Notes (Signed)
TYQUAVIOUS, GAMEL ([id number]) Visit Report for 04/27/2022 Arrival Information Details Patient Name: Date of Service: [name] [name] [name] A. 04/27/2022 2:15 PM Medical Record Number: [id number] Patient Account Number: [id number] Date of Birth/Sex: Treating RN: 07-Mar-1976 (46 y.o. [name] Primary Care Vanden Fawaz: PCP, NO Other Clinician: Referring Novah Nessel: Treating Michelle Wnek/Extender: Tilda Franco in Treatment: 5 Visit Information History Since Last Visit Added or deleted any medications: No Patient Arrived: Ambulatory Any new allergies or adverse reactions: No Arrival Time: 13:59 Had a fall or experienced change in No Accompanied By: interpreter activities of daily living that may affect Transfer Assistance: None risk of falls: Patient Identification Verified: Yes Signs or symptoms of abuse/neglect since last visito No Secondary Verification Process Completed: Yes Hospitalized since last visit: No Patient Requires Transmission-Based Precautions: No Implantable device outside of the clinic excluding No Patient Has Alerts: Yes cellular tissue based products placed in the center Patient Alerts: Translator Required since last visit: Has Dressing in Place as Prescribed: Yes Has Compression in Place as Prescribed: Yes Pain Present Now: No Electronic Signature(s) Signed: 04/27/2022 3:51:06 PM By: [name] Stall RN, BSN Entered By: [name] [name] on 04/27/2022 13:59:43 -------------------------------------------------------------------------------- Clinic Level of Care Assessment Details Patient Name: Date of Service: [name] [name] A. 04/27/2022 2:15 PM Medical Record Number: [id number] Patient Account Number: [id number] Date of Birth/Sex: Treating RN: Mar 04, 1976 (46 y.o. [name] Primary Care Kelsea Mousel: PCP, NO Other Clinician: Referring Marcayla Budge: Treating Cartel Mauss/Extender: Tilda Franco in Treatment: 5 Clinic Level of Care Assessment Items TOOL 4 Quantity  Score X- 1 0 Use when only an EandM is performed on FOLLOW-UP visit ASSESSMENTS - Nursing Assessment / Reassessment X- 1 10 Reassessment of Co-morbidities (includes updates in patient status) X- 1 5 Reassessment of Adherence to Treatment Plan ASSESSMENTS - Wound and Skin A ssessment / Reassessment []  - 0 Simple Wound Assessment / Reassessment - one wound X- 3 5 Complex Wound Assessment / Reassessment - multiple wounds X- 1 10 Dermatologic / Skin Assessment (not related to wound area) ASSESSMENTS - Focused Assessment X- 1 5 Circumferential Edema Measurements - multi extremities []  - 0 Nutritional Assessment / Counseling / Intervention []  - 0 Lower Extremity Assessment (monofilament, tuning fork, pulses) []  - 0 Peripheral Arterial Disease Assessment (using hand held doppler) ASSESSMENTS - Ostomy and/or Continence Assessment and Care []  - 0 Incontinence Assessment and Management []  - 0 Ostomy Care Assessment and Management (repouching, etc.) PROCESS - Coordination of Care []  - 0 Simple Patient / Family Education for ongoing care X- 1 20 Complex (extensive) Patient / Family Education for ongoing care X- 1 10 Staff obtains , Records, T Results / Process Orders est []  - 0 Staff telephones HHA, Nursing Homes / Clarify orders / etc []  - 0 Routine Transfer to another Facility (non-emergent condition) []  - 0 Routine Hospital Admission (non-emergent condition) []  - 0 New Admissions / / Ordering NPWT Apligraf, etc. , []  - 0 Emergency Hospital Admission (emergent condition) []  - 0 Simple Discharge Coordination X- 1 15 Complex (extensive) Discharge Coordination PROCESS - Special Needs []  - 0 Pediatric / Minor Patient Management []  - 0 Isolation Patient Management []  - 0 Hearing / Language / Visual special needs []  - 0 Assessment of Community assistance (transportation, D/C planning, etc.) []  - 0 Additional assistance / Altered  mentation []  - 0 Support Surface(s) Assessment (bed, cushion, seat, etc.) INTERVENTIONS - Wound Cleansing / Measurement []  - 0 Simple Wound Cleansing - one wound X- 3 5 Complex Wound  Cleansing - multiple wounds X- 1 5 Wound Imaging (photographs - any number of wounds) []  - 0 Wound Tracing (instead of photographs) []  - 0 Simple Wound Measurement - one wound X- 3 5 Complex Wound Measurement - multiple wounds INTERVENTIONS - Wound Dressings []  - 0 Small Wound Dressing one or multiple wounds []  - 0 Medium Wound Dressing one or multiple wounds []  - 0 Large Wound Dressing one or multiple wounds []  - 0 Application of Medications - topical []  - 0 Application of Medications - injection INTERVENTIONS - Miscellaneous []  - 0 External ear exam []  - 0 Specimen Collection (cultures, biopsies, blood, body fluids, etc.) []  - 0 Specimen(s) / Culture(s) sent or taken to Lab for analysis []  - 0 Patient Transfer (multiple staff / / Similar devices) []  - 0 Simple Staple / Suture removal (25 or less) []  - 0 Complex Staple / Suture removal (26 or more) []  - 0 Hypo / Hyperglycemic Management (close monitor of Blood Glucose) []  - 0 Ankle / Brachial Index (ABI) - do not check if billed separately X- 1 5 Vital Signs Has the patient been seen at the hospital within the last three years: Yes Total Score: 130 Level Of Care: New/Established - Level 4 Electronic Signature(s) Signed: 04/27/2022 3:51:06 PM By: RN, BSN Entered By: on 04/27/2022 14:16:12 -------------------------------------------------------------------------------- Encounter Discharge Information Details Patient Name: Date of Service:  A. 04/27/2022 2:15 PM Medical Record Number: Patient Account Number: Date of Birth/Sex: Treating RN: 15-Mar-1976 (46 y.o. Primary Care Josephanthony Tindel: PCP, NO Other Clinician: Referring Ayslin Kundert: Treating  Manilla Strieter/Extender: in Treatment: 5 Encounter Discharge Information Items Discharge Condition: Stable Ambulatory Status: Ambulatory Discharge Destination: Home Transportation: Private Auto Accompanied By: interpreter Schedule Follow-up Appointment: No Clinical Summary of Care: Electronic Signature(s) Signed: 04/27/2022 3:51:06 PM By: RN, BSN Entered By: 04/29/2022 on 04/27/2022 14:16:39 -------------------------------------------------------------------------------- Lower Extremity Assessment Details Patient Name: Date of Service: [place] 04/29/2022 A. 04/27/2022 2:15 PM Medical Record Number: [name] Patient Account Number: 04/29/2022 Date of Birth/Sex: Treating RN: [birth year] (46 y.o. 09/02/1976 Primary Care Alanzo Lamb: PCP, NO Other Clinician: Referring Tenille Morrill: Treating Almeda Ezra/Extender: 60 in Treatment: 5 Edema Assessment Assessed: [Left: Yes] [Right: No] Edema: [Left: N] [Right: o] Calf Left: Right: Point of Measurement: From Medial Instep 38.5 cm Ankle Left: Right: Point of Measurement: From Medial Instep 22 cm Vascular Assessment Pulses: Dorsalis Pedis Palpable: [Left:Yes] Electronic Signature(s) Signed: 04/27/2022 3:51:06 PM By: Tilda Franco RN, BSN Entered By: 04/29/2022 on 04/27/2022 14:05:00 -------------------------------------------------------------------------------- Multi Wound Chart Details Patient Name: Date of Service: [name] [name] A. 04/27/2022 2:15 PM Medical Record Number: [place] Patient Account Number: [name] Date of Birth/Sex: Treating RN: May 28, 1976 (46 y.o. [id number] Primary Care Andree Golphin: PCP, NO Other Clinician: Referring Tangee Marszalek: Treating Jervis Trapani/Extender: 09/02/1976 in Treatment: 5 Vital Signs Height(in): Pulse(bpm): 77 Weight(lbs): 205 Blood Pressure(mmHg): 135/81 Body Mass Index(BMI): Temperature(F): 98.9 Respiratory  Rate(breaths/min): 20 Photos: Left, Proximal, Lateral Lower Leg Left, Distal, Lateral Lower Leg Left, Anterior Lower Leg Wound Location: Gradually Appeared Gradually Appeared Gradually Appeared Wounding Event: Atypical Venous Leg Ulcer Venous Leg Ulcer Primary Etiology: Hypertension, Type II Diabetes Hypertension, Type II Diabetes Hypertension, Type II Diabetes Comorbid History: 01/10/2022 01/10/2022 04/06/2022 Date Acquired: 5 5 3  Weeks of Treatment: Open Open Open Wound Status: No No No Wound Recurrence: 0x0x0 0x0x0 0x0x0 Measurements L x W x D (cm) 0 0 0 A (cm) :  rea 0 0 0 Volume (cm) : 100.00% 100.00% 100.00% % Reduction in A rea: 100.00% 100.00% 100.00% % Reduction in Volume: Full Thickness Without Exposed Full Thickness Without Exposed Full Thickness Without Exposed Classification: Support Structures Support Structures Support Structures None Present None Present None Present Exudate Amount: Flat and Intact Flat and Intact Distinct, outline attached Wound Margin: None Present (0%) None Present (0%) None Present (0%) Granulation Amount: None Present (0%) None Present (0%) None Present (0%) Necrotic Amount: Fascia: No Fascia: No Fascia: No Exposed Structures: Fat Layer (Subcutaneous Tissue): No Fat Layer (Subcutaneous Tissue): No Fat Layer (Subcutaneous Tissue): No Tendon: No Tendon: No Tendon: No Muscle: No Muscle: No Muscle: No Joint: No Joint: No Joint: No Bone: No Bone: No Bone: No Large (67-100%) Large (67-100%) Large (67-100%) Epithelialization: Treatment Notes Electronic Signature(s) Signed: 04/27/2022 3:51:06 PM By: [name] Stall RN, BSN Signed: 04/27/2022 3:52:49 PM By: Geralyn Corwin DO Entered By: Geralyn Corwin on 04/27/2022 14:15:32 -------------------------------------------------------------------------------- Multi-Disciplinary Care Plan Details Patient Name: Date of Service: [name] [name] [name] A. 04/27/2022 2:15 PM Medical Record  Number: [id number] Patient Account Number: [id number] Date of Birth/Sex: Treating RN: [birth year] (46 y.o. [name] Primary Care Ryelle Ruvalcaba: PCP, NO Other Clinician: Referring Reonna Finlayson: Treating Giordano Getman/Extender: Tilda Franco in Treatment: 5 Active Inactive Electronic Signature(s) Signed: 04/27/2022 3:51:06 PM By: [name] Stall RN, BSN Entered By: [name] [name] on 04/27/2022 14:13:45 -------------------------------------------------------------------------------- Pain Assessment Details Patient Name: Date of Service: [name] [name] [name] A. 04/27/2022 2:15 PM Medical Record Number: [id number] Patient Account Number: [id number] Date of Birth/Sex: Treating RN: [birth year] (46 y.o. [name] Primary Care Iokepa Geffre: PCP, NO Other Clinician: Referring Cristle Jared: Treating Oland Arquette/Extender: Tilda Franco in Treatment: 5 Active Problems Location of Pain Severity and Description of Pain Patient Has Paino No Site Locations Rate the pain. Current Pain Level: 0 Pain Management and Medication Current Pain Management: Medication: No Cold Application: No Rest: No Massage: No Activity: No T.E.N.S.: No Heat Application: No Leg drop or elevation: No Is the Current Pain Management Adequate: Adequate How does your wound impact your activities of daily livingo Sleep: No Bathing: No Appetite: No Relationship With Others: No Bladder Continence: No Emotions: No Bowel Continence: No Work: No Toileting: No Drive: No Dressing: No Hobbies: No Electronic Signature(s) Signed: 04/27/2022 3:51:06 PM By: [name] Stall RN, BSN Entered By: [name] [name] on 04/27/2022 14:00:53 -------------------------------------------------------------------------------- Patient/Caregiver Education Details Patient Name: Date of Service: [name] [name] 7/17/2023andnbsp2:15 PM Medical Record Number: [id number] Patient Account Number: [id number] Date of Birth/Gender: Treating  RN: January 18, 1976 (46 y.o. [name] Primary Care Physician: PCP, NO Other Clinician: Referring Physician: Treating Physician/Extender: Tilda Franco in Treatment: 5 Education Assessment Education Provided To: Patient Education Topics Provided Venous: Handouts: Controlling Swelling with Compression Stockings Spanish Methods: Explain/Verbal Responses: Reinforcements needed Electronic Signature(s) Signed: 04/27/2022 3:51:06 PM By: [name] Stall RN, BSN Entered By: [name] [name] on 04/27/2022 14:13:58 -------------------------------------------------------------------------------- Wound Assessment Details Patient Name: Date of Service: [name] [name] [name] A. 04/27/2022 2:15 PM Medical Record Number: [id number] Patient Account Number: [id number] Date of Birth/Sex: Treating RN: Sep 17, 1976 (46 y.o. [name] Primary Care Taylah Dubiel: PCP, NO Other Clinician: Referring Ariele Vidrio: Treating Dillon Livermore/Extender: Tilda Franco in Treatment: 5 Wound Status Wound Number: 1 Primary Etiology: Atypical Wound Location: Left, Proximal, Lateral Lower Leg Wound Status: Open Wounding Event: Gradually Appeared Comorbid History: Hypertension, Type II Diabetes Date Acquired: 01/10/2022 Weeks Of Treatment: 5 Clustered Wound: No Photos Wound Measurements Length: (cm) Width: (cm) Depth: (cm) Area: (cm) Volume: (cm) 0 %  Reduction in Area: 100% 0 % Reduction in Volume: 100% 0 Epithelialization: Large (67-100%) 0 Tunneling: No 0 Undermining: No Wound Description Classification: Full Thickness Without Exposed Support Structures Wound Margin: Flat and Intact Exudate Amount: None Present Foul Odor After Cleansing: No Slough/Fibrino No Wound Bed Granulation Amount: None Present (0%) Exposed Structure Necrotic Amount: None Present (0%) Fascia Exposed: No Fat Layer (Subcutaneous Tissue) Exposed: No Tendon Exposed: No Muscle Exposed: No Joint Exposed: No Bone Exposed:  No Electronic Signature(s) Signed: 04/27/2022 3:51:06 PM By: [name] Stalleaton, Bobbi RN, BSN Entered By: [name] Stalleaton, Bobbi on 04/27/2022 14:10:43 -------------------------------------------------------------------------------- Wound Assessment Details Patient Name: Date of Service: [name] [name], [name] [place] A. 04/27/2022 2:15 PM Medical Record Number: [id number] Patient Account Number: [id number] Date of Birth/Sex: Treating RN: Jul 15, 1976 (46 y.o. Tammy SoursM) Deaton, Bobbi Primary Care Paton Crum: PCP, NO Other Clinician: Referring Shannell Mikkelsen: Treating Kamal Jurgens/Extender: Tilda FrancoHoffman, Jessica Weeks in Treatment: 5 Wound Status Wound Number: 2 Primary Etiology: Venous Leg Ulcer Wound Location: Left, Distal, Lateral Lower Leg Wound Status: Open Wounding Event: Gradually Appeared Comorbid History: Hypertension, Type II Diabetes Date Acquired: 01/10/2022 Weeks Of Treatment: 5 Clustered Wound: No Photos Wound Measurements Length: (cm) Width: (cm) Depth: (cm) Area: (cm) Volume: (cm) 0 % Reduction in Area: 100% 0 % Reduction in Volume: 100% 0 Epithelialization: Large (67-100%) 0 Tunneling: No 0 Undermining: No Wound Description Classification: Full Thickness Without Exposed Support Structures Wound Margin: Flat and Intact Exudate Amount: None Present Foul Odor After Cleansing: No Slough/Fibrino No Wound Bed Granulation Amount: None Present (0%) Exposed Structure Necrotic Amount: None Present (0%) Fascia Exposed: No Fat Layer (Subcutaneous Tissue) Exposed: No Tendon Exposed: No Muscle Exposed: No Joint Exposed: No Bone Exposed: No Electronic Signature(s) Signed: 04/27/2022 3:51:06 PM By: [name] Stalleaton, Bobbi RN, BSN Entered By: [name] Stalleaton, Bobbi on 04/27/2022 14:11:00 -------------------------------------------------------------------------------- Wound Assessment Details Patient Name: Date of Service: [name] [name], [name] [place] A. 04/27/2022 2:15 PM Medical Record Number: [id number] Patient Account Number: [id number] Date of  Birth/Sex: Treating RN: Jul 15, 1976 (46 y.o. Tammy SoursM) Deaton, Bobbi Primary Care Jaelin Devincentis: PCP, NO Other Clinician: Referring Jurgen Groeneveld: Treating Gurpreet Mariani/Extender: Tilda FrancoHoffman, Jessica Weeks in Treatment: 5 Wound Status Wound Number: 3 Primary Etiology: Venous Leg Ulcer Wound Location: Left, Anterior Lower Leg Wound Status: Open Wounding Event: Gradually Appeared Comorbid History: Hypertension, Type II Diabetes Date Acquired: 04/06/2022 Weeks Of Treatment: 3 Clustered Wound: No Photos Wound Measurements Length: (cm) Width: (cm) Depth: (cm) Area: (cm) Volume: (cm) 0 % Reduction in Area: 100% 0 % Reduction in Volume: 100% 0 Epithelialization: Large (67-100%) 0 Tunneling: No 0 Undermining: No Wound Description Classification: Full Thickness Without Exposed Support Structures Wound Margin: Distinct, outline attached Exudate Amount: None Present Foul Odor After Cleansing: No Slough/Fibrino No Wound Bed Granulation Amount: None Present (0%) Exposed Structure Necrotic Amount: None Present (0%) Fascia Exposed: No Fat Layer (Subcutaneous Tissue) Exposed: No Tendon Exposed: No Muscle Exposed: No Joint Exposed: No Bone Exposed: No Electronic Signature(s) Signed: 04/27/2022 3:51:06 PM By: [name] Stalleaton, Bobbi RN, BSN Entered By: [name] Stalleaton, Bobbi on 04/27/2022 14:11:13 -------------------------------------------------------------------------------- Vitals Details Patient Name: Date of Service: [name] [name], [name] [place] A. 04/27/2022 2:15 PM Medical Record Number: [id number] Patient Account Number: [id number] Date of Birth/Sex: Treating RN: Jul 15, 1976 (46 y.o. Tammy SoursM) Deaton, Bobbi Primary Care Marianna Cid: PCP, NO Other Clinician: Referring Brynlea Spindler: Treating Kym Fenter/Extender: Tilda FrancoHoffman, Jessica Weeks in Treatment: 5 Vital Signs Time Taken: 13:55 Temperature (F): 98.9 Weight (lbs): 205 Pulse (bpm): 77 Respiratory Rate (breaths/min): 20 Blood Pressure (mmHg): 135/81 Reference Range: 80 - 120 mg /  dl Electronic Signature(s) Signed: 04/27/2022 3:51:06 PM By: [name] Stalleaton, Bobbi  RN, BSN Entered By: [name] [name] on 04/27/2022 14:00:47

## 2022-05-04 ENCOUNTER — Ambulatory Visit (HOSPITAL_BASED_OUTPATIENT_CLINIC_OR_DEPARTMENT_OTHER): Payer: Self-pay | Admitting: Internal Medicine

## 2022-05-07 ENCOUNTER — Ambulatory Visit
Admission: EM | Admit: 2022-05-07 | Discharge: 2022-05-07 | Disposition: A | Discharge status: Home / Self Care | Payer: Self-pay | Attending: Physician Assistant | Admitting: Physician Assistant

## 2022-05-07 DIAGNOSIS — R112 Nausea with vomiting, unspecified: Secondary | ICD-10-CM

## 2022-05-07 DIAGNOSIS — R197 Diarrhea, unspecified: Secondary | ICD-10-CM

## 2022-05-07 DIAGNOSIS — R1013 Epigastric pain: Secondary | ICD-10-CM

## 2022-05-07 MED ORDER — ONDANSETRON 4 MG PO TBDP: 4.0000 mg | ORAL_TABLET | Freq: Three times a day (TID) | ORAL | 0 refills | Status: AC | PRN

## 2022-05-07 NOTE — ED Provider Notes (Signed)
[facility]    CSN: [id number] Arrival date & time: 05/07/22  0803      History   Chief Complaint Chief Complaint  Patient presents with   Abdominal Pain    HPI [name] [name] [name] [name] is a 46 y.o. male.   Patient here today for evaluation of nausea, vomiting, diarrhea and epigastric pain that seemed to start about 30 mins after he used an insecticide powder in his home for cockroaches. He denies any ingestion that he is aware of unless he inhaled some while asleep. He reports his symptoms started 3 days ago but seem to be improving. He denies any blood in his stool or dark tarry stools. He has not vomited today and denies any blood in his vomit. He has not had fever. He denies shortness of breath. He does not report treatment for symptoms.   The history is provided by the patient.    Past Medical History:  Diagnosis Date   Hypertension     Patient Active Problem List   Diagnosis Date Noted   Essential hypertension 04/06/2018    History reviewed. No pertinent surgical history.     Home Medications    Prior to Admission medications   Medication Sig Start Date End Date Taking? Authorizing Provider  ondansetron (ZOFRAN-ODT) 4 MG disintegrating tablet Take 1 tablet (4 mg total) by mouth every 8 (eight) hours as needed. 05/07/22  Yes Tomi Bamberger, PA-C  doxycycline (VIBRAMYCIN) 100 MG capsule Take 1 capsule (100 mg total) by mouth 2 (two) times daily. 02/18/22   Gerhard Munch, MD  ibuprofen (ADVIL) 600 MG tablet Take 1 tablet (600 mg total) by mouth every 6 (six) hours as needed. 05/06/21   Rhys Martini, PA-C  lisinopril (ZESTRIL) 10 MG tablet Take by mouth. 01/16/20   [provider]    Family History Family History  Family history unknown: Yes    Social History Social History   Tobacco Use   Smoking status: Never   Smokeless tobacco: Never  Substance Use Topics   Alcohol use: Yes    Comment: occ   Drug use: No     Allergies    Patient has no known allergies.   Review of Systems Review of Systems  Constitutional:  Negative for chills and fever.  Eyes:  Negative for discharge and redness.  Respiratory:  Negative for shortness of breath.   Gastrointestinal:  Positive for abdominal pain, diarrhea, nausea and vomiting. Negative for abdominal distention and blood in stool.     Physical Exam Triage Vital Signs ED Triage Vitals  Enc Vitals Group     BP      Pulse      Resp      Temp      Temp src      SpO2      Weight      Height      Head Circumference      Peak Flow      Pain Score      Pain Loc      Pain Edu?      Excl. in GC?    No data found.  Updated Vital Signs BP 133/83   Pulse 76   Temp 98.3 F (36.8 C)   Resp 18   SpO2 96%      Physical Exam Vitals and nursing note reviewed.  Constitutional:      General: He is not in acute distress.    Appearance: Normal  appearance. He is not ill-appearing.  HENT:     Head: Normocephalic and atraumatic.  Eyes:     Conjunctiva/sclera: Conjunctivae normal.  Cardiovascular:     Rate and Rhythm: Normal rate and regular rhythm.     Heart sounds: Normal heart sounds. No murmur heard. Pulmonary:     Effort: Pulmonary effort is normal. No respiratory distress.     Breath sounds: Normal breath sounds. No wheezing, rhonchi or rales.  Abdominal:     General: Abdomen is flat. Bowel sounds are normal. There is no distension.     Palpations: Abdomen is soft.     Tenderness: There is abdominal tenderness (mild epigastric TTP). There is no guarding or rebound.  Genitourinary:    Penis: Normal.   Skin:    General: Skin is warm and dry.  Neurological:     Mental Status: He is alert.  Psychiatric:        Mood and Affect: Mood normal.        Behavior: Behavior normal.      UC Treatments / Results  Labs (all labs ordered are listed, but only abnormal results are displayed) Labs Reviewed  CBC WITH DIFFERENTIAL/PLATELET  COMPREHENSIVE METABOLIC  PANEL  AMYLASE  LIPASE    EKG   Radiology No results found.  Procedures Procedures (including critical care time)  Medications Ordered in UC Medications - No data to display  Initial Impression / Assessment and Plan / UC Course  I have reviewed the triage vital signs and the nursing notes.  Pertinent labs & imaging results that were available during my care of the patient were reviewed by me and considered in my medical decision making (see chart for details).    Discussed most likely viral etiology of symptoms rather than accidental ingestion. Will trial zofran and encouraged increased fluids, electrolyte replacement with gatorade. Recommended evaluation in the ED with any worsening symptoms, patient expresses understanding. Routine labs ordered.   Final Clinical Impressions(s) / UC Diagnoses   Final diagnoses:  Abdominal pain, epigastric  Nausea vomiting and diarrhea   Discharge Instructions   None    ED Prescriptions     Medication Sig Dispense Auth. Provider   ondansetron (ZOFRAN-ODT) 4 MG disintegrating tablet Take 1 tablet (4 mg total) by mouth every 8 (eight) hours as needed. 20 tablet Tomi Bamberger, PA-C      PDMP not reviewed this encounter.   Tomi Bamberger, PA-C 05/07/22 [phone]

## 2022-05-07 NOTE — ED Triage Notes (Signed)
Pt presents with abd pain nausea and vomiting, pt reports he was applying insecticide outside when it started. Pt reports unable to eat/ drink without pain and vomiting. Pt reports 8/10 mid sternal pain.

## 2022-05-10 LAB — CBC WITH DIFFERENTIAL/PLATELET
Basophils Absolute: 0 10*3/uL (ref 0.0–0.2)
Basos: 1 %
EOS (ABSOLUTE): 0.2 10*3/uL (ref 0.0–0.4)
Eos: 5 %
Hematocrit: 45.3 % (ref 37.5–51.0)
Hemoglobin: 15.4 g/dL (ref 13.0–17.7)
Immature Grans (Abs): 0 10*3/uL (ref 0.0–0.1)
Immature Granulocytes: 0 %
Lymphocytes Absolute: 1.2 10*3/uL (ref 0.7–3.1)
Lymphs: 32 %
MCH: 33.8 pg — ABNORMAL HIGH (ref 26.6–33.0)
MCHC: 34 g/dL (ref 31.5–35.7)
MCV: 100 fL — ABNORMAL HIGH (ref 79–97)
Monocytes Absolute: 0.3 10*3/uL (ref 0.1–0.9)
Monocytes: 7 %
Neutrophils Absolute: 2 10*3/uL (ref 1.4–7.0)
Neutrophils: 55 %
RBC: 4.55 x10E6/uL (ref 4.14–5.80)
RDW: 14 % (ref 11.6–15.4)
WBC: 3.7 10*3/uL (ref 3.4–10.8)

## 2022-05-10 LAB — COMPREHENSIVE METABOLIC PANEL
ALT: 55 IU/L — ABNORMAL HIGH (ref 0–44)
AST: 103 IU/L — ABNORMAL HIGH (ref 0–40)
Albumin/Globulin Ratio: 0.9 — ABNORMAL LOW (ref 1.2–2.2)
Albumin: 3.6 g/dL — ABNORMAL LOW (ref 4.1–5.1)
Alkaline Phosphatase: 238 IU/L — ABNORMAL HIGH (ref 44–121)
BUN/Creatinine Ratio: 13 (ref 9–20)
BUN: 9 mg/dL (ref 6–24)
Bilirubin Total: 1.9 mg/dL — ABNORMAL HIGH (ref 0.0–1.2)
CO2: 19 mmol/L — ABNORMAL LOW (ref 20–29)
Calcium: 8.4 mg/dL — ABNORMAL LOW (ref 8.7–10.2)
Chloride: 107 mmol/L — ABNORMAL HIGH (ref 96–106)
Creatinine, Ser: 0.71 mg/dL — ABNORMAL LOW (ref 0.76–1.27)
Globulin, Total: 4 g/dL (ref 1.5–4.5)
Glucose: 121 mg/dL — ABNORMAL HIGH (ref 70–99)
Potassium: 3.9 mmol/L (ref 3.5–5.2)
Sodium: 140 mmol/L (ref 134–144)
Total Protein: 7.6 g/dL (ref 6.0–8.5)
eGFR: 116 mL/min/{1.73_m2} (ref >59)

## 2022-05-10 LAB — LIPASE: Lipase: 42 U/L (ref 13–78)

## 2022-05-10 LAB — AMYLASE: Amylase: 82 U/L (ref 31–110)

## 2022-05-21 ENCOUNTER — Emergency Department (HOSPITAL_COMMUNITY)
Admission: EM | Admit: 2022-05-21 | Discharge: 2022-05-22 | Disposition: A | Discharge status: Home / Self Care | Payer: No Typology Code available for payment source | Attending: Emergency Medicine | Admitting: Emergency Medicine

## 2022-05-21 ENCOUNTER — Other Ambulatory Visit: Payer: Self-pay

## 2022-05-21 ENCOUNTER — Emergency Department (HOSPITAL_COMMUNITY): Payer: No Typology Code available for payment source

## 2022-05-21 ENCOUNTER — Encounter (HOSPITAL_COMMUNITY): Payer: Self-pay

## 2022-05-21 DIAGNOSIS — R630 Anorexia: Secondary | ICD-10-CM | POA: Diagnosis not present

## 2022-05-21 DIAGNOSIS — R079 Chest pain, unspecified: Secondary | ICD-10-CM | POA: Diagnosis not present

## 2022-05-21 DIAGNOSIS — I10 Essential (primary) hypertension: Secondary | ICD-10-CM | POA: Insufficient documentation

## 2022-05-21 DIAGNOSIS — R42 Dizziness and giddiness: Secondary | ICD-10-CM | POA: Diagnosis not present

## 2022-05-21 DIAGNOSIS — Z79899 Other long term (current) drug therapy: Secondary | ICD-10-CM | POA: Diagnosis not present

## 2022-05-21 DIAGNOSIS — R197 Diarrhea, unspecified: Secondary | ICD-10-CM | POA: Diagnosis not present

## 2022-05-21 DIAGNOSIS — R1084 Generalized abdominal pain: Secondary | ICD-10-CM | POA: Diagnosis present

## 2022-05-21 DIAGNOSIS — K7460 Unspecified cirrhosis of liver: Secondary | ICD-10-CM

## 2022-05-21 DIAGNOSIS — R682 Dry mouth, unspecified: Secondary | ICD-10-CM | POA: Insufficient documentation

## 2022-05-21 DIAGNOSIS — R112 Nausea with vomiting, unspecified: Secondary | ICD-10-CM | POA: Diagnosis not present

## 2022-05-21 LAB — TROPONIN I (HIGH SENSITIVITY)
Troponin I (High Sensitivity): 11 ng/L (ref <18)
Troponin I (High Sensitivity): 9 ng/L (ref <18)

## 2022-05-21 LAB — CBC
HCT: 46.5 % (ref 39.0–52.0)
Hemoglobin: 16 g/dL (ref 13.0–17.0)
MCH: 34.4 pg — ABNORMAL HIGH (ref 26.0–34.0)
MCHC: 34.4 g/dL (ref 30.0–36.0)
MCV: 100 fL (ref 80.0–100.0)
Platelets: 44 10*3/uL — ABNORMAL LOW (ref 150–400)
RBC: 4.65 MIL/uL (ref 4.22–5.81)
RDW: 13.7 % (ref 11.5–15.5)
WBC: 6.6 10*3/uL (ref 4.0–10.5)
nRBC: 0 % (ref 0.0–0.2)

## 2022-05-21 LAB — COMPREHENSIVE METABOLIC PANEL
ALT: 36 U/L (ref 0–44)
AST: 60 U/L — ABNORMAL HIGH (ref 15–41)
Albumin: 3.4 g/dL — ABNORMAL LOW (ref 3.5–5.0)
Alkaline Phosphatase: 177 U/L — ABNORMAL HIGH (ref 38–126)
Anion gap: 9 (ref 5–15)
BUN: 9 mg/dL (ref 6–20)
CO2: 19 mmol/L — ABNORMAL LOW (ref 22–32)
Calcium: 8.4 mg/dL — ABNORMAL LOW (ref 8.9–10.3)
Chloride: 109 mmol/L (ref 98–111)
Creatinine, Ser: 0.69 mg/dL (ref 0.61–1.24)
GFR, Estimated: >60 mL/min (ref >60)
Glucose, Bld: 115 mg/dL — ABNORMAL HIGH (ref 70–99)
Potassium: 3.7 mmol/L (ref 3.5–5.1)
Sodium: 137 mmol/L (ref 135–145)
Total Bilirubin: 2.3 mg/dL — ABNORMAL HIGH (ref 0.3–1.2)
Total Protein: 7.7 g/dL (ref 6.5–8.1)

## 2022-05-21 LAB — LIPASE, BLOOD: Lipase: 42 U/L (ref 11–51)

## 2022-05-21 MED ORDER — SODIUM CHLORIDE 0.9 % IV BOLUS
1000.0000 mL | Freq: Once | INTRAVENOUS | Status: AC
  Administered 2022-05-21: 1000 mL via INTRAVENOUS

## 2022-05-21 MED ORDER — ALBUTEROL SULFATE HFA 108 (90 BASE) MCG/ACT IN AERS
2.0000 | INHALATION_SPRAY | RESPIRATORY_TRACT | Status: DC | PRN
  Administered 2022-05-21: 2 via RESPIRATORY_TRACT
  Filled 2022-05-21: qty 6.7

## 2022-05-21 MED ORDER — ONDANSETRON HCL 4 MG/2ML IJ SOLN
4.0000 mg | Freq: Once | INTRAMUSCULAR | Status: AC
Start: 2022-05-21 — End: 2022-05-21
  Administered 2022-05-21: 4 mg via INTRAVENOUS
  Filled 2022-05-21: qty 2

## 2022-05-21 MED ORDER — IOHEXOL 300 MG/ML  SOLN
100.0000 mL | Freq: Once | INTRAMUSCULAR | Status: AC | PRN
Start: 2022-05-21 — End: 2022-05-21
  Administered 2022-05-21: 100 mL via INTRAVENOUS

## 2022-05-21 NOTE — ED Provider Triage Note (Signed)
Emergency Medicine Provider Triage Evaluation Note  Edouard Gikas , a 46 y.o. male  was evaluated in triage.  Pt complains of chest pain, shortness of breath. States that same began 2 weeks ago when he sprayed insecticide in his home. Pain is located in his left chest. States that same is associated with nausea and vomiting as well as epigastric abdominal pain. Went to urgent care and was given zofran but doesn't feel that it made it any better so he stopped taking them. States that his last vomiting episode was yesterday.   Review of Systems  Positive:  Negative:   Physical Exam  Ht 6' (1.829 m)   Wt 90.7 kg   BMI 27.12 kg/m  Gen:   Awake, no distress   Resp:  Normal effort  MSK:   Moves extremities without difficulty  Other:    Medical Decision Making  Medically screening exam initiated at 7:03 PM.  Appropriate orders placed.  Omere A [name] Bradly Bienenstock was informed that the remainder of the evaluation will be completed by another provider, this initial triage assessment does not replace that evaluation, and the importance of remaining in the ED until their evaluation is complete.     Vear Clock 05/21/22 1908

## 2022-05-21 NOTE — ED Provider Notes (Signed)
[facility]-EMERGENCY DEPT Provider Note   CSN: [id number] Arrival date & time: 05/21/22  1810     History  Chief Complaint  Patient presents with   Chest Pain   Abdominal Pain   Emesis    [place] A [name] Bradly Bienenstock is a 46 y.o. male.  The history is provided by the patient and the spouse. The history is limited by a language barrier. A language interpreter was used.  Chest Pain Associated symptoms: abdominal pain and vomiting   Abdominal Pain Associated symptoms: chest pain and vomiting   Emesis Associated symptoms: abdominal pain      Khian is a 46 yo w/ hx of HTN presents to ED for of generalized abdominal pain x 2 weeks, nausea x 2 weeks, vomiting x over 1 week, diarrhea x 5 days, and chest pain x 5 days. He reports spraying Cockroach spray 2 weeks ago and 1 day after, the abdominal pain began. Pain is all over his abdomen and does not radiate. He has had diarrhea, loss of appetite, and dry mouth. Last bm was this morning. He says his abdominal pain is constant and nothing seems to make it better. He has tried the Zofran he was prescribed at urgent care for the same chief complaint and did not feel relief. He reports currently having chest pain. He reports the pain stays on the left side and does not radiate. The pain is sharp and constant. It has not improved in quality since onset 5 days ago. He says he has also had dizziness and chest pain when he breathes. He denies syncopal episodes, weakness, vision changes, confusion or syncopal episodes.  Home Medications Prior to Admission medications   Medication Sig Start Date End Date Taking? Authorizing Provider  doxycycline (VIBRAMYCIN) 100 MG capsule Take 1 capsule (100 mg total) by mouth 2 (two) times daily. 02/18/22   Gerhard Munch, MD  ibuprofen (ADVIL) 600 MG tablet Take 1 tablet (600 mg total) by mouth every 6 (six) hours as needed. 05/06/21   Rhys Martini, PA-C  lisinopril (ZESTRIL) 10 MG tablet Take by  mouth. 01/16/20   [provider]  ondansetron (ZOFRAN-ODT) 4 MG disintegrating tablet Take 1 tablet (4 mg total) by mouth every 8 (eight) hours as needed. 05/07/22   Tomi Bamberger, PA-C      Allergies    Patient has no known allergies.    Review of Systems   Review of Systems  Cardiovascular:  Positive for chest pain.  Gastrointestinal:  Positive for abdominal pain and vomiting.  All other systems reviewed and are negative.   Physical Exam Updated Vital Signs BP 117/75   Pulse 83   Temp 98.8 F (37.1 C) (Oral)   Resp 20   Ht 6' (1.829 m)   Wt 90.7 kg   SpO2 93%   BMI 27.12 kg/m  Physical Exam Vitals and nursing note reviewed.  Constitutional:      General: He is not in acute distress.    Appearance: He is well-developed.  HENT:     Head: Atraumatic.     Right Ear: Tympanic membrane normal.     Left Ear: Tympanic membrane normal.     Nose: Nose normal.  Eyes:     Extraocular Movements: Extraocular movements intact.     Conjunctiva/sclera: Conjunctivae normal.     Pupils: Pupils are equal, round, and reactive to light.  Cardiovascular:     Rate and Rhythm: Normal rate and regular rhythm.  Pulses: Normal pulses.     Heart sounds: Normal heart sounds.  Pulmonary:     Effort: Pulmonary effort is normal.     Breath sounds: Normal breath sounds. No wheezing, rhonchi or rales.  Abdominal:     Palpations: Abdomen is soft.     Tenderness: There is abdominal tenderness (Abdomen diffusely tender without any focal point tenderness). There is no guarding or rebound.  Musculoskeletal:        General: Normal range of motion.     Cervical back: Normal range of motion and neck supple.  Skin:    General: Skin is warm.     Findings: No rash.  Neurological:     Mental Status: He is alert. Mental status is at baseline.     ED Results / Procedures / Treatments   Labs (all labs ordered are listed, but only abnormal results are displayed) Labs Reviewed  CBC -  Abnormal; Notable for the following components:      Result Value   MCH 34.4 (*)    Platelets 44 (*)    All other components within normal limits  COMPREHENSIVE METABOLIC PANEL  LIPASE, BLOOD  TROPONIN I (HIGH SENSITIVITY)  TROPONIN I (HIGH SENSITIVITY)    EKG None  Radiology DG Chest 2 View  Result Date: 05/21/2022 CLINICAL DATA:  Chest pain.  Shortness of breath. EXAM: CHEST - 2 VIEW COMPARISON:  None Available. FINDINGS: Lung volumes are low. There is mild peribronchial thickening. The heart is normal in size with normal mediastinal contours. Streaky atelectasis in the bases, no confluent consolidation. No pneumothorax or pleural effusion. No acute osseous findings. IMPRESSION: Low lung volumes with mild peribronchial thickening, may represent bronchitis or asthma. Electronically Signed   By: Keith Rake M.D.   On: 05/21/2022 19:28    Procedures Procedures    Medications Ordered in ED Medications  sodium chloride 0.9 % bolus 1,000 mL (has no administration in time range)  ondansetron (ZOFRAN) injection 4 mg (has no administration in time range)  albuterol (VENTOLIN HFA) 108 (90 Base) MCG/ACT inhaler 2 puff (has no administration in time range)    ED Course/ Medical Decision Making/ A&P                           Medical Decision Making Amount and/or Complexity of Data Reviewed Labs: ordered. Radiology: ordered.   BP (!) 130/92   Pulse 79   Temp 98.8 F (37.1 C) (Oral)   Resp 13   Ht 6' (1.829 m)   Wt 90.7 kg   SpO2 98%   BMI 27.12 kg/m   9:53 PM This is a 46 year old male, Hispanic speaking who presents for concerns of insecticide poisoning.  History obtained through language interpreter.  2 weeks ago patient was spraying for cockroach and the next day he developed pain in his chest and abdomen.  Since then he has had dry mouth, eye irritation, diarrhea and loss of appetite.  He endorsed diffuse abdominal pain ongoing at least for the past 5 days.  He  endorsed chest discomfort when he breathes but denies any significant shortness of breath.  He was seen at urgent care several days prior and with nausea he was prescribed Zofran.  He tries taking the medication but states it has not helped much.  He feels dehydrated.  On exam patient is overall well-appearing appears to be in no acute discomfort.  Heart and lung sounds normal.  Abdomen with active bowel  sounds however it is diffusely tender without any guarding or rebound tenderness.  Skin exam unremarkable.  Patient without any focal neurodeficit.  10:11 PM Plan to provide symptomatic treatment and will follow-up on labs.  Patient signed out to oncoming provider who will reassess patient and determine disposition.  I have consider contacting poison control however incident happened approximately 2 weeks ago and at this time I felt supportive care is the most appropriate approach.  I have low suspicion for acute abdominal pathology such as appendicitis, colitis, diverticulitis, cholecystitis, UTI, gastritis, GERD.  I did order albuterol inhaler to use as needed likely experiencing some reactive airway disease        Final Clinical Impression(s) / ED Diagnoses Final diagnoses:  None    Rx / DC Orders ED Discharge Orders     None         Fayrene Helper, PA-C 05/21/22 2212    Rolan Bucco, MD 05/21/22 2216

## 2022-05-21 NOTE — ED Triage Notes (Signed)
Patient reports mid chest pain and SOB x 2 weeks. Patient states that he has been vomiting and having abdominal pain after spraying insecticides 2 weeks ago.  Patient states that he was given a RX for N/V, but" really didn't help."

## 2022-05-21 NOTE — ED Provider Notes (Signed)
Patient taken in sign out at shift change.  Abdominal pain, n/v/d.  Follow-up on labs.  I added on CT abdomen/pelvis due to the epigastric pain and concern for pancreatitis.  This was negative for pancreatitis.  Lipase is normal.  CT was consistent with cirrhosis of the liver.  I discussed this with the patient.  I have encouraged him to follow-up with his PCP and with GI.   Roxy Horseman, PA-C 05/22/22 Conley Canal, MD 05/22/22 [phone]

## 2022-05-22 MED ORDER — PROMETHAZINE HCL 25 MG PO TABS
25.0000 mg | ORAL_TABLET | Freq: Four times a day (QID) | ORAL | 0 refills | Status: AC | PRN
Start: 2022-05-22 — End: ?

## 2022-05-22 NOTE — ED Notes (Signed)
D/C instructions reviewed with patient using the spanish ipad interpreter. Pt verbalized understanding of d/c instructions and follow up care. Pt A&OX4 ambulatory at d/c with independent steady gait.

## 2022-05-22 NOTE — Discharge Instructions (Addendum)
You need to follow-up with a gastroenterologist and with your primary care doctor.  Please contact the group listed.

## 2022-07-22 ENCOUNTER — Emergency Department (HOSPITAL_COMMUNITY)
Admission: EM | Admit: 2022-07-22 | Discharge: 2022-07-22 | Disposition: A | Discharge status: Home / Self Care | Payer: No Typology Code available for payment source | Attending: Emergency Medicine | Admitting: Emergency Medicine

## 2022-07-22 ENCOUNTER — Other Ambulatory Visit: Payer: Self-pay

## 2022-07-22 ENCOUNTER — Encounter (HOSPITAL_COMMUNITY): Payer: Self-pay | Admitting: Emergency Medicine

## 2022-07-22 DIAGNOSIS — M5431 Sciatica, right side: Secondary | ICD-10-CM | POA: Diagnosis not present

## 2022-07-22 DIAGNOSIS — M549 Dorsalgia, unspecified: Secondary | ICD-10-CM | POA: Diagnosis present

## 2022-07-22 MED ORDER — KETOROLAC TROMETHAMINE 30 MG/ML IJ SOLN
30.0000 mg | Freq: Once | INTRAMUSCULAR | Status: AC
  Administered 2022-07-22: 30 mg via INTRAMUSCULAR
  Filled 2022-07-22: qty 1

## 2022-07-22 MED ORDER — DICLOFENAC SODIUM 1 % EX GEL: 2.0000 g | Freq: Four times a day (QID) | CUTANEOUS | 0 refills | Status: AC

## 2022-07-22 MED ORDER — CYCLOBENZAPRINE HCL 10 MG PO TABS: 10.0000 mg | ORAL_TABLET | Freq: Three times a day (TID) | ORAL | 0 refills | Status: AC | PRN

## 2022-07-22 NOTE — ED Provider Notes (Signed)
[facility]-EMERGENCY DEPT [facility] Emergency Department Provider Note MRN:  [id number]  Arrival date & time: 07/22/22     Chief Complaint   Back Pain   History of Present Illness   [name] [name] is a 46 y.o. year-old male presents to the ED with chief complaint of back pain.  He states he has had the pain for a while, but it worsened after work today.  He states that it radiates into his right leg. He denies any trouble using the bathroom.  Denies fever.  He states that he is able to walk.    He also complaints of he has a chronic wound on his leg       Review of Systems  Pertinent positive and negative review of systems noted in HPI.    Physical Exam   Vitals:   07/22/22 2042  BP: 130/84  Pulse: 96  Resp: 18  Temp: 98.4 F (36.9 C)  SpO2: 94%    CONSTITUTIONAL:  non toxic-appearing, NAD NEURO:  Alert and oriented x 3, CN 3-12 grossly intact EYES:  eyes equal and reactive ENT/NECK:  Supple, no stridor  CARDIO:  normal rate, appears well-perfused  PULM:  No respiratory distress,  GI/GU:  non-distended,  MSK/SPINE:  No gross deformities, no edema, moves all extremities, mild lumbar paraspinal muscle tenderness SKIN:  no rash, atraumatic, wounds on RLE in various stages of healing   *Additional and/or pertinent findings included in MDM below  Diagnostic and Interventional Summary    Labs Reviewed - No data to display  No orders to display    Medications  ketorolac (TORADOL) 30 MG/ML injection 30 mg (30 mg Intramuscular Given 07/22/22 2250)     Procedures  /  Critical Care Procedures  ED Course and Medical Decision Making  I have reviewed the triage vital signs, the nursing notes, and pertinent available records from the EMR.  Social Determinants Affecting Complexity of Care: Patient has no clinically significant social determinants affecting this chief complaint..   ED Course:    Medical Decision Making Patient with back pain.    No  neurological deficits and normal neuro exam.  Patient is ambulatory.  No loss of bowel or bladder control.  Doubt cauda equina.  Denies fever,  doubt epidural abscess or other lesion. Recommend back exercises, stretching, RICE, and will treat with a short course of flexeril.  Encouraged the patient that there could be a need for additional workup and/or imaging such as MRI, if the symptoms do not resolve. Patient advised that if the back pain does not resolve, or radiates, this could progress to more serious conditions and is encouraged to follow-up with PCP or orthopedics within 2 weeks.     Risk Prescription drug management.     Consultants: No consultations were needed in caring for this patient.   Treatment and Plan: Emergency department workup does not suggest an emergent condition requiring admission or immediate intervention beyond  what has been performed at this time. The patient is safe for discharge and has  been instructed to return immediately for worsening symptoms, change in  symptoms or any other concerns    Final Clinical Impressions(s) / ED Diagnoses     ICD-10-CM   1. Sciatica of right side  M54.31       ED Discharge Orders          Ordered    cyclobenzaprine (FLEXERIL) 10 MG tablet  3 times daily PRN  07/22/22 2239    diclofenac Sodium (VOLTAREN) 1 % GEL  4 times daily        07/22/22 2239              Discharge Instructions Discussed with and Provided to Patient:   Discharge Instructions   None      Montine Circle, PA-C 07/22/22 2320    Charlesetta Shanks, MD 07/23/22 1500

## 2022-07-22 NOTE — ED Triage Notes (Signed)
Pt c/o lower back pain that shoots down right leg since Friday. Pt also c/o wound drainage from wound on left lower leg, pt has been seen for the same before.

## 2022-07-28 ENCOUNTER — Emergency Department (HOSPITAL_COMMUNITY)
Admission: EM | Admit: 2022-07-28 | Discharge: 2022-07-28 | Disposition: A | Discharge status: Home / Self Care | Payer: No Typology Code available for payment source | Attending: Emergency Medicine | Admitting: Emergency Medicine

## 2022-07-28 ENCOUNTER — Encounter (HOSPITAL_COMMUNITY): Payer: Self-pay | Admitting: *Deleted

## 2022-07-28 ENCOUNTER — Other Ambulatory Visit: Payer: Self-pay

## 2022-07-28 DIAGNOSIS — M5441 Lumbago with sciatica, right side: Secondary | ICD-10-CM | POA: Diagnosis not present

## 2022-07-28 DIAGNOSIS — M545 Low back pain, unspecified: Secondary | ICD-10-CM | POA: Diagnosis present

## 2022-07-28 DIAGNOSIS — M5431 Sciatica, right side: Secondary | ICD-10-CM

## 2022-07-28 MED ORDER — LIDOCAINE 5 % EX PTCH
1.0000 | MEDICATED_PATCH | CUTANEOUS | Status: DC
Start: 2022-07-28 — End: 2022-07-28
  Administered 2022-07-28: 1 via TRANSDERMAL
  Filled 2022-07-28: qty 1

## 2022-07-28 MED ORDER — CYCLOBENZAPRINE HCL 10 MG PO TABS
5.0000 mg | ORAL_TABLET | Freq: Once | ORAL | Status: AC
Start: 2022-07-28 — End: 2022-07-28
  Administered 2022-07-28: 5 mg via ORAL
  Filled 2022-07-28: qty 1

## 2022-07-28 MED ORDER — LIDOCAINE 4 % EX PTCH: MEDICATED_PATCH | CUTANEOUS | 0 refills | Status: AC

## 2022-07-28 MED ORDER — KETOROLAC TROMETHAMINE 60 MG/2ML IM SOLN
60.0000 mg | Freq: Once | INTRAMUSCULAR | Status: AC
  Administered 2022-07-28: 60 mg via INTRAMUSCULAR
  Filled 2022-07-28: qty 2

## 2022-07-28 NOTE — Discharge Instructions (Addendum)
Please follow-up with your primary care doctor.  Use medications as prescribed.

## 2022-07-28 NOTE — ED Provider Notes (Signed)
[facility] DEPT Provider Note   CSN: [id number] Arrival date & time: 07/28/22  1032     History  Chief Complaint  Patient presents with   Back Pain    [name] [name] is a 46 y.o. male, no pertinent past medical history, who presents to the ED secondary to right back pain that radiates to his right leg for the past week.  States that he was seen recently for the same thing, and felt better after being seen, however he went back to work, for the last couple days, and his pain is gotten worse.  Has not taken anything at home for this pain.  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  Is requesting time off/work restrictions given that lifting makes his pain worse.  Denies any loss of bowel, bladder, fever, chills.     Home Medications Prior to Admission medications   Medication Sig Start Date End Date Taking? Authorizing Provider  lidocaine (HM LIDOCAINE PATCH) 4 % Removed after 12 hours and keep off for at least 12 hours. 07/28/22  Yes Aarit Kashuba L, PA  cyclobenzaprine (FLEXERIL) 10 MG tablet Take 1 tablet (10 mg total) by mouth 3 (three) times daily as needed for muscle spasms. 07/22/22   Montine Circle, PA-C  diclofenac Sodium (VOLTAREN) 1 % GEL Apply 2 g topically 4 (four) times daily. 07/22/22   Montine Circle, PA-C  doxycycline (VIBRAMYCIN) 100 MG capsule Take 1 capsule (100 mg total) by mouth 2 (two) times daily. Patient not taking: Reported on 05/21/2022 02/18/22   Carmin Muskrat, MD  ibuprofen (ADVIL) 600 MG tablet Take 1 tablet (600 mg total) by mouth every 6 (six) hours as needed. Patient not taking: Reported on 05/21/2022 05/06/21   Hazel Sams, PA-C  ondansetron (ZOFRAN-ODT) 4 MG disintegrating tablet Take 1 tablet (4 mg total) by mouth every 8 (eight) hours as needed. 05/07/22   Francene Finders, PA-C  promethazine (PHENERGAN) 25 MG tablet Take 1 tablet (25 mg total) by mouth every 6 (six) hours as  needed for nausea or vomiting. 05/22/22   Montine Circle, PA-C      Allergies    Patient has no known allergies.    Review of Systems   Review of Systems  Constitutional:  Negative for chills and fever.  Musculoskeletal:  Positive for back pain.    Physical Exam Updated Vital Signs BP (!) 150/95 (BP Location: Right Arm)   Pulse (!) 102   Temp 98.2 F (36.8 C) (Oral)   Resp 16   Ht 6' (1.829 m)   SpO2 98%   BMI 27.21 kg/m  Physical Exam Constitutional:      Appearance: Normal appearance.  HENT:     Mouth/Throat:     Mouth: Mucous membranes are moist.  Cardiovascular:     Rate and Rhythm: Normal rate and regular rhythm.     Pulses: Normal pulses.  Pulmonary:     Effort: Pulmonary effort is normal.  Musculoskeletal:     Comments: Back: +midline ttp of L5, right paraspinal muscle ttp, +SLR of right leg, -SLR of left leg. No neurodeficits. Sensation intact. 5/5 strength of BLE  Neurological:     Mental Status: He is alert.     ED Results / Procedures / Treatments   Labs (all labs ordered are listed, but only abnormal results are displayed) Labs Reviewed - No data to display  EKG None  Radiology No results found.  Procedures Procedures   Medications Ordered in ED Medications  lidocaine (LIDODERM) 5 % 1 patch (1 patch Transdermal Patch Applied 07/28/22 1236)  ketorolac (TORADOL) injection 60 mg (60 mg Intramuscular Given 07/28/22 1237)  cyclobenzaprine (FLEXERIL) tablet 5 mg (5 mg Oral Given 07/28/22 1227)    ED Course/ Medical Decision Making/ A&P                           Medical Decision Making Risk OTC drugs. Prescription drug management.   Patient is a 46 year old male, here for back pain, states he has had this before, denies any trauma, has tenderness to palpation of L5, and his right flank, with positive straight leg raise of right leg.  Concerning for sciatica versus herniated disc causing sciatica.  Discussed with patient, he would like a  work note, declines further imaging.  States he just wants some restrictions with his work, because every time he goes to work causes the pain to flareup.  Has not taken any pain meds for it.  Given work note, lidocaine patch for home.  Instructed to take ibuprofen as needed, that he has prescription for Flexeril.  Return precautions emphasized. Final Clinical Impression(s) / ED Diagnoses Final diagnoses:  Sciatica of right side    Rx / DC Orders ED Discharge Orders          Ordered    lidocaine (HM LIDOCAINE PATCH) 4 %        07/28/22 1245              [name] [name], [place], [place] 07/28/22 1253    Pricilla Loveless, MD 07/29/22 [phone]

## 2022-07-28 NOTE — ED Triage Notes (Signed)
Pt present with rt back pain going into rt leg, has been seen for same pain several times

## 2022-10-19 IMAGING — DX DG LUMBAR SPINE COMPLETE 4+V
5 series · 5 of 5 positions shown · non-contrast
Comparison: No prior.

CLINICAL DATA: Worsening low back pain.

EXAM:
LUMBAR SPINE - COMPLETE 4+ VIEW

[lumbar spine ap]
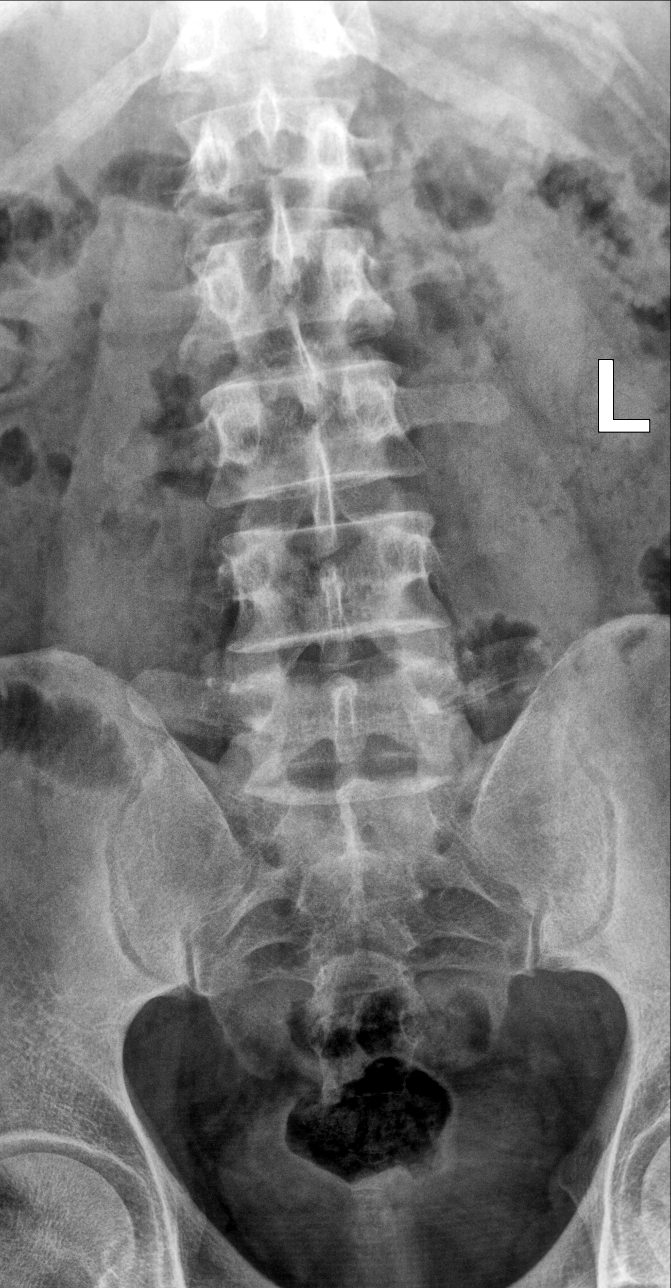

[lumbar spine oblique supine (1 of 2)]
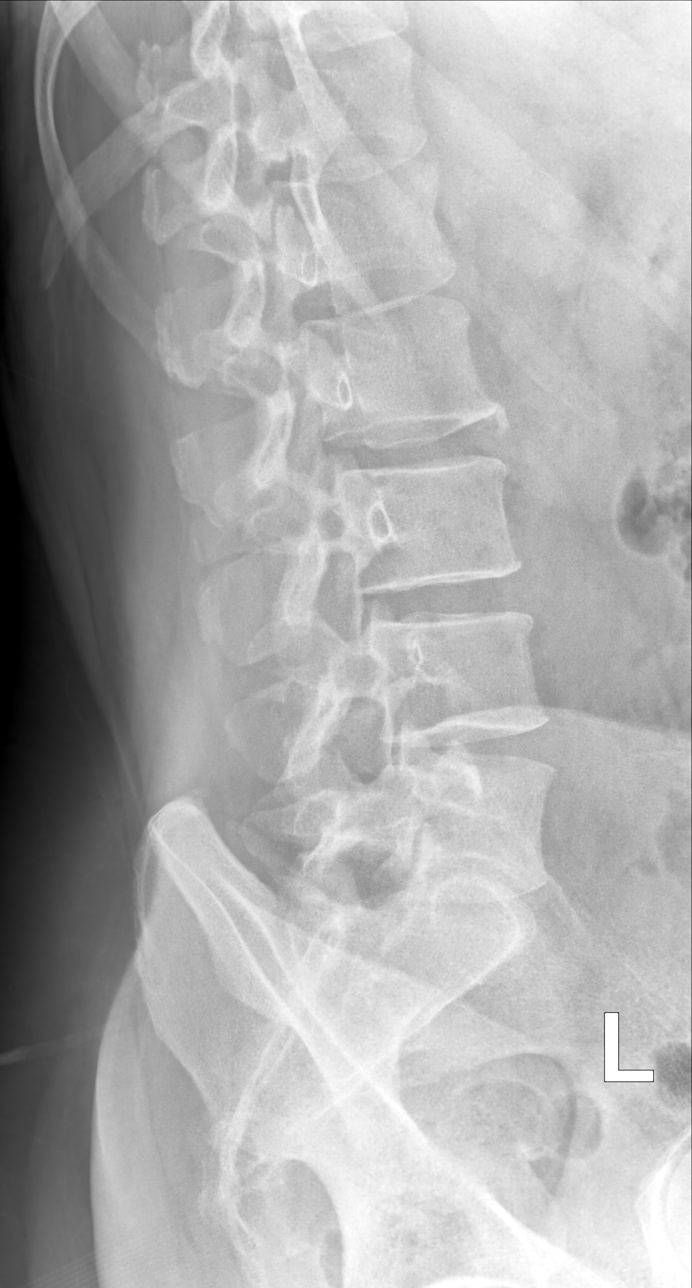

[lumbar spine oblique supine (2 of 2)]
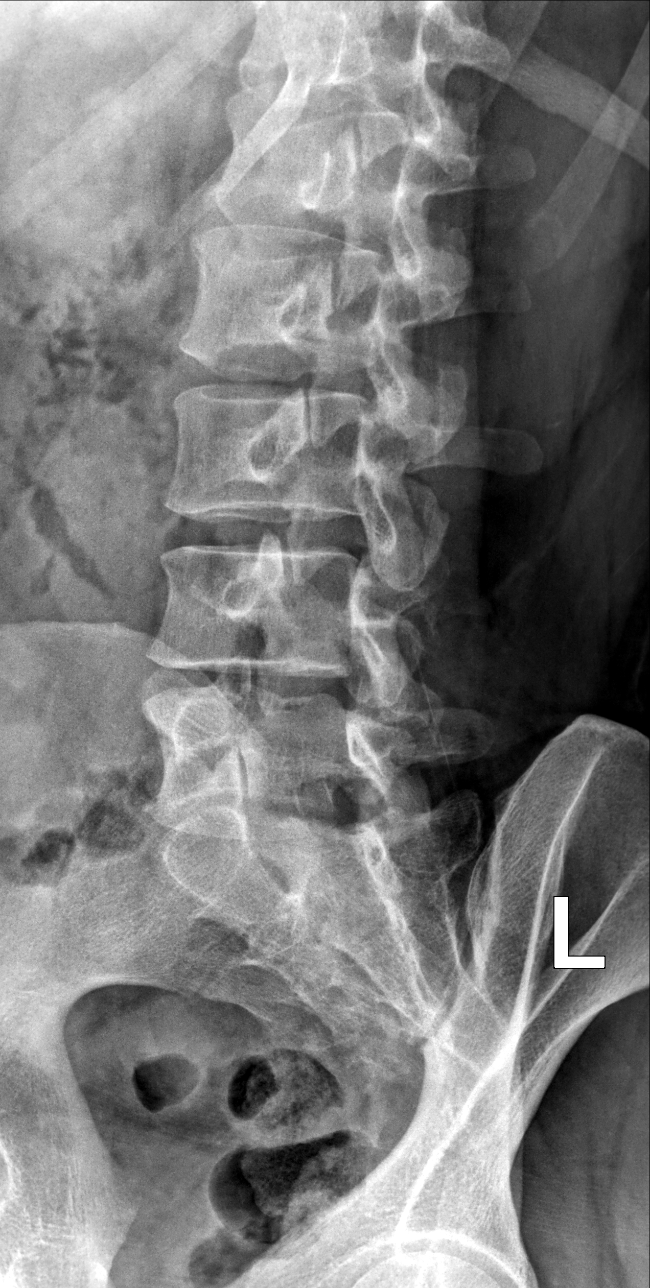

[lumbar spine lateral position lat]
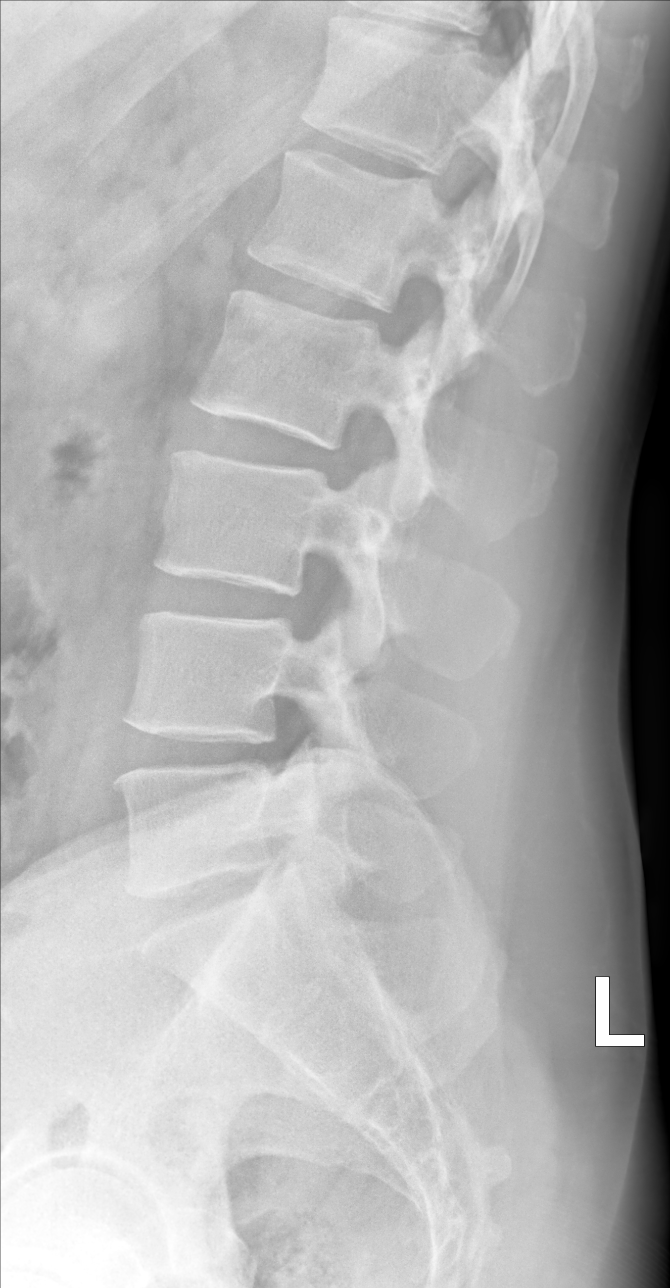

[lumbar spine lat]
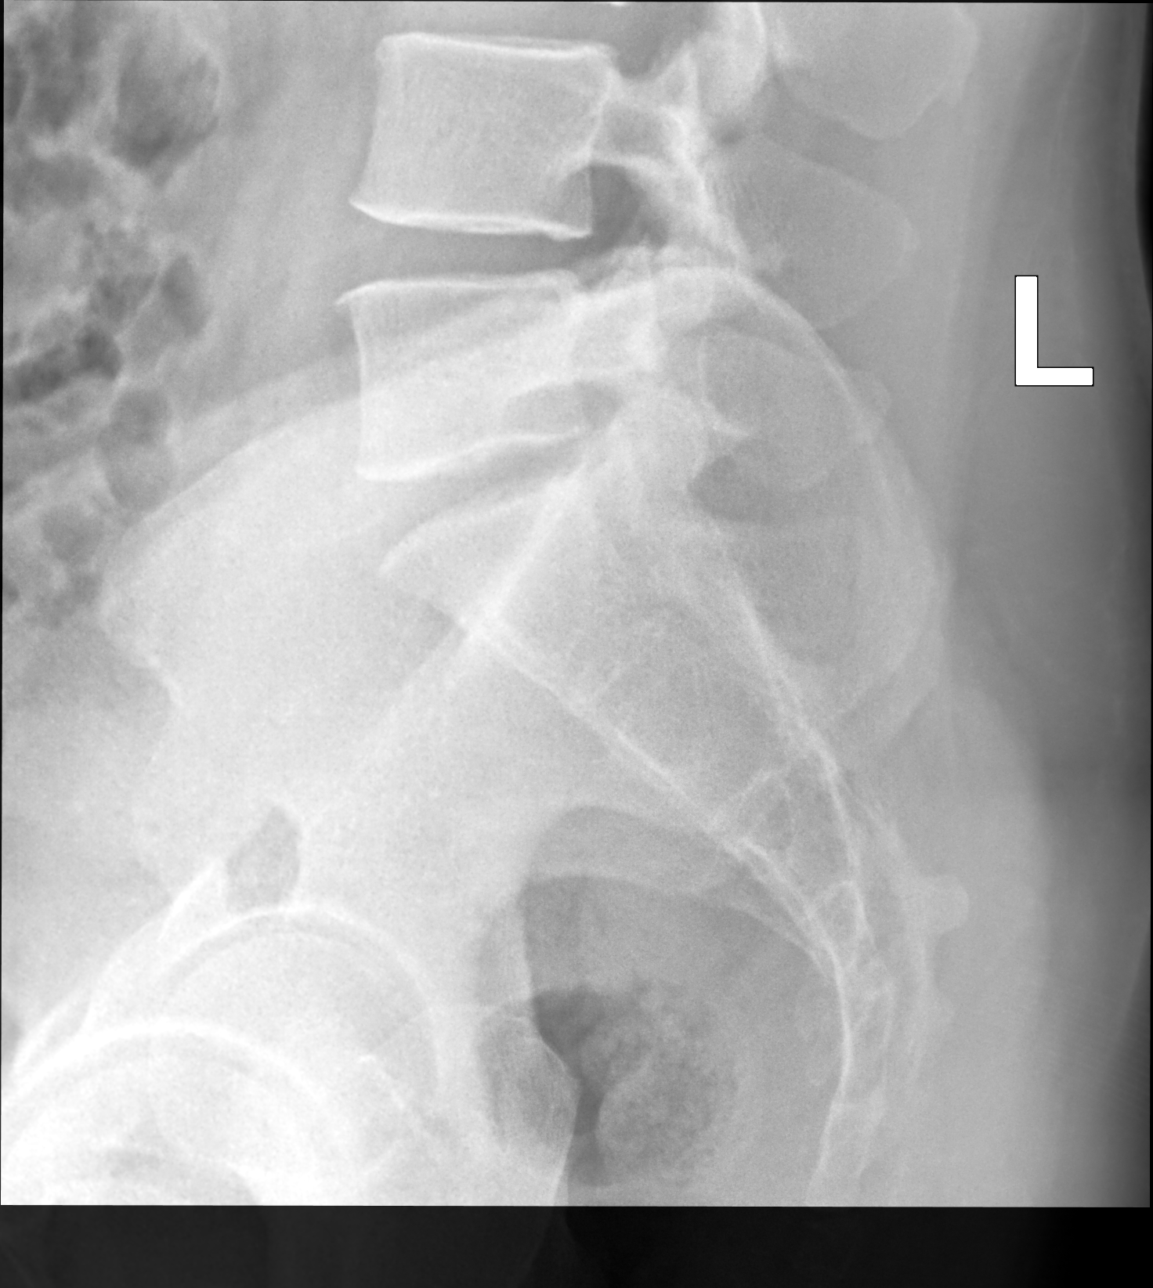

[5 of 5 positions shown; findings below may reference images not displayed]

FINDINGS: Lumbar vertebra numbered the lowest segmented appearing lumbar
shaped vertebrae on lateral view as L5. Paraspinal soft tissues are
unremarkable. Mild degenerative endplate osteophyte formation L4-L5.
No acute bony abnormality. No evidence of fracture.
IMPRESSION: Mild L4-L5 degenerative change.  No acute abnormality identified.

## 2023-09-02 ENCOUNTER — Ambulatory Visit
Admission: EM | Admit: 2023-09-02 | Discharge: 2023-09-02 | Disposition: A | Discharge status: Home / Self Care | Payer: No Typology Code available for payment source | Attending: Family Medicine | Admitting: Family Medicine

## 2023-09-02 DIAGNOSIS — M25511 Pain in right shoulder: Secondary | ICD-10-CM

## 2023-09-02 MED ORDER — NAPROXEN 500 MG PO TABS: 500.0000 mg | ORAL_TABLET | Freq: Two times a day (BID) | ORAL | 0 refills | Status: AC

## 2023-09-02 NOTE — ED Triage Notes (Addendum)
Pt states right shoulder pain for the past 2 weeks.  States he does heavy lifting at work. Has been taking tylenol at home with some relief.

## 2023-09-02 NOTE — ED Provider Notes (Signed)
[facility]    CSN: [id number] Arrival date & time: 09/02/23  0805      History   Chief Complaint Chief Complaint  Patient presents with   Shoulder Pain    HPI [place] A [name] Bradly Bienenstock is a 47 y.o. male.    Shoulder Pain  Personal interpreter is present.  Patient is here for right shoulder pain x 2 weeks.  He points to the lateral aspect of the shoulder.  He does a lot of heavy lifting for work.   He states he lifted something heavy, and then dropped it due to the weight.  This pulled the shoulder downward.  He did take tylenol with some help.  He also has pain to the right elbow as well.        Past Medical History:  Diagnosis Date   Hypertension     Patient Active Problem List   Diagnosis Date Noted   Essential hypertension 04/06/2018    History reviewed. No pertinent surgical history.     Home Medications    Prior to Admission medications   Medication Sig Start Date End Date Taking? Authorizing Provider  cyclobenzaprine (FLEXERIL) 10 MG tablet Take 1 tablet (10 mg total) by mouth 3 (three) times daily as needed for muscle spasms. 07/22/22   Roxy Horseman, PA-C  diclofenac Sodium (VOLTAREN) 1 % GEL Apply 2 g topically 4 (four) times daily. 07/22/22   Roxy Horseman, PA-C  doxycycline (VIBRAMYCIN) 100 MG capsule Take 1 capsule (100 mg total) by mouth 2 (two) times daily. Patient not taking: Reported on 05/21/2022 02/18/22   Gerhard Munch, MD  ibuprofen (ADVIL) 600 MG tablet Take 1 tablet (600 mg total) by mouth every 6 (six) hours as needed. Patient not taking: Reported on 05/21/2022 05/06/21   Rhys Martini, PA-C  lidocaine (HM LIDOCAINE PATCH) 4 % Removed after 12 hours and keep off for at least 12 hours. 07/28/22   Small, Brooke L, PA  ondansetron (ZOFRAN-ODT) 4 MG disintegrating tablet Take 1 tablet (4 mg total) by mouth every 8 (eight) hours as needed. 05/07/22   Tomi Bamberger, PA-C  promethazine (PHENERGAN) 25 MG tablet Take 1  tablet (25 mg total) by mouth every 6 (six) hours as needed for nausea or vomiting. 05/22/22   Roxy Horseman, PA-C    Family History Family History  Family history unknown: Yes    Social History Social History   Tobacco Use   Smoking status: Never   Smokeless tobacco: Never  Vaping Use   Vaping status: Never Used  Substance Use Topics   Alcohol use: Yes    Comment: occ   Drug use: No     Allergies   Patient has no known allergies.   Review of Systems Review of Systems  Constitutional: Negative.   HENT: Negative.    Respiratory: Negative.    Cardiovascular: Negative.   Gastrointestinal: Negative.   Musculoskeletal:  Positive for arthralgias.  Psychiatric/Behavioral: Negative.       Physical Exam Triage Vital Signs ED Triage Vitals [09/02/23 0820]  Encounter Vitals Group     BP (!) 162/96     Systolic BP Percentile      Diastolic BP Percentile      Pulse Rate 71     Resp 16     Temp 98.4 F (36.9 C)     Temp Source Oral     SpO2 96 %     Weight      Height  Head Circumference      Peak Flow      Pain Score 7     Pain Loc      Pain Education      Exclude from Growth Chart    No data found.  Updated Vital Signs BP (!) 162/96 (BP Location: Left Arm)   Pulse 71   Temp 98.4 F (36.9 C) (Oral)   Resp 16   SpO2 96%   Visual Acuity Right Eye Distance:   Left Eye Distance:   Bilateral Distance:    Right Eye Near:   Left Eye Near:    Bilateral Near:     Physical Exam Constitutional:      Appearance: Normal appearance.  Cardiovascular:     Rate and Rhythm: Normal rate and regular rhythm.  Pulmonary:     Effort: Pulmonary effort is normal.     Breath sounds: Normal breath sounds.  Musculoskeletal:     Comments: TTP to the right anterior shoulder and lateral shoulder;  TTP down the right arm to the lateral elbow;  full rom of the shoulder/elbow, with pain with full rotation; normal strength to the UE bilaterally  Neurological:      General: No focal deficit present.     Mental Status: He is alert.  Psychiatric:        Mood and Affect: Mood normal.      UC Treatments / Results  Labs (all labs ordered are listed, but only abnormal results are displayed) Labs Reviewed - No data to display  EKG   Radiology No results found.  Procedures Procedures (including critical care time)  Medications Ordered in UC Medications - No data to display  Initial Impression / Assessment and Plan / UC Course  I have reviewed the triage vital signs and the nursing notes.  Pertinent labs & imaging results that were available during my care of the patient were reviewed by me and considered in my medical decision making (see chart for details).    Final Clinical Impressions(s) / UC Diagnoses   Final diagnoses:  Pain in joint of right shoulder     Discharge Instructions      Le atendieron hoy por dolor en el hombro.  Le he enviado un medicamento para ayudar con Chief Technology Officer.  Sin embargo, le recomiendo que consulte con un ortopedista si contina teniendo Engineer, mining.  Llame a [facility] al [phone] para programar una cita.  You were seen today for shoulder pain.  I have sent out a medication to help with pain.  However, I recommend you follow up with an orthopedist if you continue to have pain.  Please call [facility] at [phone] to make an appointment.     ED Prescriptions     Medication Sig Dispense Auth. Provider   naproxen (NAPROSYN) 500 MG tablet Take 1 tablet (500 mg total) by mouth 2 (two) times daily. 30 tablet Jannifer Franklin, MD      PDMP not reviewed this encounter.   Jannifer Franklin, MD 09/02/23 [phone]

## 2023-09-02 NOTE — Discharge Instructions (Signed)
Le atendieron hoy por dolor en el hombro.  Le he enviado un medicamento para ayudar con Chief Technology Officer.  Sin embargo, le recomiendo que consulte con un ortopedista si contina teniendo Engineer, mining.  Llame a [facility] al [phone] para programar una cita.  You were seen today for shoulder pain.  I have sent out a medication to help with pain.  However, I recommend you follow up with an orthopedist if you continue to have pain.  Please call [facility] at [phone] to make an appointment.
